# Patient Record
Sex: Female | Born: 1990
Health system: Southern US, Community
[De-identification: ages and names within clinical notes are randomized; demographics above are authoritative.]

## PROBLEM LIST (undated history)

## (undated) DIAGNOSIS — F32A Depression, unspecified: Secondary | ICD-10-CM

## (undated) DIAGNOSIS — Z87898 Personal history of other specified conditions: Secondary | ICD-10-CM

## (undated) DIAGNOSIS — F329 Major depressive disorder, single episode, unspecified: Secondary | ICD-10-CM

## (undated) DIAGNOSIS — R630 Anorexia: Secondary | ICD-10-CM

## (undated) DIAGNOSIS — Z9109 Other allergy status, other than to drugs and biological substances: Secondary | ICD-10-CM

## (undated) HISTORY — PX: NO PAST SURGERIES: SHX2092

## (undated) HISTORY — PX: EYE SURGERY: SHX253

## (undated) HISTORY — DX: Depression, unspecified: F32.A

## (undated) HISTORY — DX: Other allergy status, other than to drugs and biological substances: Z91.09

## (undated) HISTORY — DX: Major depressive disorder, single episode, unspecified: F32.9

## (undated) HISTORY — DX: Anorexia: R63.0

---

## 2015-12-23 LAB — HM PAP SMEAR: HM Pap smear: NEGATIVE

## 2016-01-25 LAB — HM PAP SMEAR: HM PAP: NORMAL

## 2017-03-02 ENCOUNTER — Encounter: Payer: Self-pay | Admitting: Physician Assistant

## 2017-03-02 ENCOUNTER — Ambulatory Visit (INDEPENDENT_AMBULATORY_CARE_PROVIDER_SITE_OTHER): Payer: 59 | Admitting: Physician Assistant

## 2017-03-02 VITALS — BP 110/70 | HR 79 | Ht 63.0 in | Wt 162.9 lb

## 2017-03-02 DIAGNOSIS — Z0000 Encounter for general adult medical examination without abnormal findings: Secondary | ICD-10-CM | POA: Diagnosis not present

## 2017-03-02 DIAGNOSIS — Z114 Encounter for screening for human immunodeficiency virus [HIV]: Secondary | ICD-10-CM

## 2017-03-02 DIAGNOSIS — N912 Amenorrhea, unspecified: Secondary | ICD-10-CM | POA: Diagnosis not present

## 2017-03-02 DIAGNOSIS — Z23 Encounter for immunization: Secondary | ICD-10-CM

## 2017-03-02 DIAGNOSIS — Z136 Encounter for screening for cardiovascular disorders: Secondary | ICD-10-CM

## 2017-03-02 LAB — POCT URINE PREGNANCY: Preg Test, Ur: NEGATIVE

## 2017-03-02 NOTE — Progress Notes (Addendum)
SCRIBE STATEMENT  Subjective:    Monica Jacobs is a 26 y.o. female and is here for a comprehensive physical exam.  HPI  Health Maintenance Due  Topic Date Due  . HIV Screening  01/31/2004    Acute Concerns: Amenorrhea -- no period since March, she states that this happens every 6 months or so, she is unconcerned, uses condoms with husband, is not actively trying to conceive, no significant weight changes or other symptoms, states that her last period was very regular, no unusual vaginal discharge or pain  Chronic Issues: None  Health Maintenance: Immunizations -- Tdap due today and agreeable Mammogram -- no early ultrasounds or early mammograms have been performed PAP -- requesting records, most recently completed last year and was normal per her report Diet -- 1-2 days of meatless meals a week, likes snacks; does not drink soda Caffeine intake -- drinks 1-2 large coffees daily, no energy drinks Sleep habits -- very regular sleeps from about 11am - 7am Exercise -- walks dog, does some at home work-outs, uses fitbit, plays kickball Monday nights Weight -- Weight: 162 lb 14.4 oz (73.9 kg)  Mood -- no anxiety issues, negative screen for depression Last period -- end of March Period characteristics -- periods are well controlled with ibuprofen and hot packs Birth control -- currently uses condoms; was on the pill stopped taking it secondary to side effects -- it helped her periods but made her hormonal  Depression screen PHQ 2/9 03/02/2017  Decreased Interest 0  Down, Depressed, Hopeless 0  PHQ - 2 Score 0   Other providers/specialists: Ob-Gyn -- [facility] Up to date on dental care Needs to visit the eye doctor   PMHx, SurgHx, SocialHx, Medications, and Allergies were reviewed in the Visit Navigator and updated as appropriate.   Past Medical History:  Diagnosis Date  . Environmental allergies     History reviewed. No pertinent surgical history.   Family  History  Problem Relation Age of Onset  . Anxiety disorder Mother   . Hypercholesterolemia Mother   . ADD / ADHD Mother   . Hyperlipidemia Father   . Cancer Father     skin  . Heart attack Maternal Grandmother   . Diabetes Maternal Grandmother   . Mental illness Sister   . Diabetes Maternal Grandfather   . Heart disease Maternal Grandfather   . Hypertension Maternal Grandfather   . Colon cancer Neg Hx   . Breast cancer Neg Hx   . Prostate cancer Neg Hx     Social History  Substance Use Topics  . Smoking status: Never Smoker  . Smokeless tobacco: Never Used  . Alcohol use Yes     Comment: Occasionally    Review of Systems:   Review of Systems  Constitutional: Negative.  Negative for chills, fever, malaise/fatigue and weight loss.  HENT: Negative.  Negative for hearing loss, sinus pain and sore throat.   Eyes: Negative.   Respiratory: Negative.  Negative for cough and hemoptysis.   Cardiovascular: Negative.  Negative for chest pain, palpitations, leg swelling and PND.  Gastrointestinal: Negative.  Negative for abdominal pain, constipation, diarrhea, heartburn, nausea and vomiting.  Genitourinary: Negative.  Negative for dysuria, frequency and urgency.  Musculoskeletal: Negative.  Negative for back pain, myalgias and neck pain.  Skin: Negative.  Negative for itching and rash.  Neurological: Negative.  Negative for dizziness, tingling, seizures and headaches.  Endo/Heme/Allergies: Negative.  Negative for polydipsia.  Psychiatric/Behavioral: Negative.  Negative for depression. The  patient is not nervous/anxious.     Objective:   BP 110/70 (BP Location: Left Arm, Patient Position: Sitting, Cuff Size: Normal)   Pulse 79   Ht 5\' 3"  (1.6 m)   Wt 162 lb 14.4 oz (73.9 kg)   LMP 01/18/2017 (Approximate) Comment: Date ended.  SpO2 99%   BMI 28.86 kg/m   General Appearance:    Alert, cooperative, no distress, appears stated age  Head:    Normocephalic, without obvious  abnormality, atraumatic  Eyes:    PERRL, conjunctiva/corneas clear, EOM's intact, fundi    benign, both eyes  Ears:    Normal TM's and external ear canals, both ears  Nose:   Nares normal, septum midline, mucosa normal, no drainage    or sinus tenderness  Throat:   Lips, mucosa, and tongue normal; teeth and gums normal  Neck:   Supple, symmetrical, trachea midline, no adenopathy;    thyroid:  no enlargement/tenderness/nodules; no carotid   bruit or JVD  Back:     Symmetric, no curvature, ROM normal, no CVA tenderness  Lungs:     Clear to auscultation bilaterally, respirations unlabored  Chest Wall:    No tenderness or deformity   Heart:    Regular rate and rhythm, S1 and S2 normal, no murmur, rub   or gallop  Breast Exam:    Deferred, sees ob-gyn  Abdomen:     Soft, non-tender, bowel sounds active all four quadrants,    no masses, no organomegaly  Genitalia:    Deferred, sees Ob-Gyn  Rectal:    Deferred, sees Ob-Gyn  Extremities:   Extremities normal, atraumatic, no cyanosis or edema  Pulses:   2+ and symmetric all extremities  Skin:   Skin color, texture, turgor normal, no rashes or lesions  Lymph nodes:   Cervical, supraclavicular, and axillary nodes normal  Neurologic:   CNII-XII intact, normal strength, sensation and reflexes    throughout    Results for orders placed or performed in visit on 03/02/17  HM PAP SMEAR  Result Value Ref Range   HM Pap smear Normal   POCT urine pregnancy  Result Value Ref Range   Preg Test, Ur Negative Negative     Assessment/Plan:   Vincent was seen today for establish care with cpe.  Diagnoses and all orders for this visit:  Routine medical exam Today patient counseled on age appropriate routine health concerns for screening and prevention, each reviewed and up to date or declined. Immunizations reviewed and up to date or declined. Labs ordered and reviewed. Risk factors for depression reviewed and negative. Hearing function and visual  acuity are intact. ADLs screened and addressed as needed. Functional ability and level of safety reviewed and appropriate. Education, counseling and referrals performed based on assessed risks today. Patient provided with a copy of personalized plan for preventive services. She is not fasting, will complete lab work within 1-2 weeks.  Encounter for HIV screening Agreeable to HIV screening  Encounter for screening for cardiovascular disease She is not fasting, will complete at later date.  Amenorrhea Negative urine pregnancy test. Will check TSH as well as routine labs. Follow-up with ob-gyn if period does not return at the end of this month.    Well Adult Exam: Labs ordered: Yes. Patient counseling was done. See below for items discussed. Discussed the patient's BMI. The BMI BMI is not in the acceptable range; BMI management plan is completed Follow up as needed for acute illness.  Patient Counseling:   [  x]    Nutrition: Stressed importance of moderation in sodium/caffeine intake, saturated fat and cholesterol, caloric balance, sufficient intake of fresh fruits, vegetables, fiber, calcium, iron, and 1 mg of folate supplement per day (for females capable of pregnancy).   [x]      Stressed the importance of regular exercise.    [x]     Substance Abuse: Discussed cessation/primary prevention of tobacco, alcohol, or other drug use; driving or other dangerous activities under the influence; availability of treatment for abuse.    [x]      Injury prevention: Discussed safety belts, safety helmets, smoke detector, smoking near bedding or upholstery.    [x]      Sexuality: Discussed sexually transmitted diseases, partner selection, use of condoms, avoidance of unintended pregnancy  and contraceptive alternatives.    [x]     Dental health: Discussed importance of regular tooth brushing, flossing, and dental visits.   [x]      Health maintenance and immunizations reviewed. Please refer to  Health maintenance section.   CMA or LPN served as scribe during this visit. History, Physical, and Plan performed by medical provider. Documentation and orders reviewed and attested to.  Inda Coke, PA-C [place]

## 2017-03-02 NOTE — Patient Instructions (Signed)
It was great meeting you today!  Please make an appointment with the lab on your way out. I would like for you to return for lab work within 1-2 weeks. After midnight on the day of the lab draw, please do not eat anything. You may have water, black coffee, unsweetened tea.    Health Maintenance, Female Adopting a healthy lifestyle and getting preventive care can go a long way to promote health and wellness. Talk with your health care provider about what schedule of regular examinations is right for you. This is a good chance for you to check in with your provider about disease prevention and staying healthy. In between checkups, there are plenty of things you can do on your own. Experts have done a lot of research about which lifestyle changes and preventive measures are most likely to keep you healthy. Ask your health care provider for more information. Weight and diet Eat a healthy diet  Be sure to include plenty of vegetables, fruits, low-fat dairy products, and lean protein.  Do not eat a lot of foods high in solid fats, added sugars, or salt.  Get regular exercise. This is one of the most important things you can do for your health.  Most adults should exercise for at least 150 minutes each week. The exercise should increase your heart rate and make you sweat (moderate-intensity exercise).  Most adults should also do strengthening exercises at least twice a week. This is in addition to the moderate-intensity exercise. Maintain a healthy weight  Body mass index (BMI) is a measurement that can be used to identify possible weight problems. It estimates body fat based on height and weight. Your health care provider can help determine your BMI and help you achieve or maintain a healthy weight.  For females 52 years of age and older:  A BMI below 18.5 is considered underweight.  A BMI of 18.5 to 24.9 is normal.  A BMI of 25 to 29.9 is considered overweight.  A BMI of 30 and above is  considered obese. Watch levels of cholesterol and blood lipids  You should start having your blood tested for lipids and cholesterol at 28 years of age, then have this test every 5 years.  You may need to have your cholesterol levels checked more often if:  Your lipid or cholesterol levels are high.  You are older than 28 years of age.  You are at high risk for heart disease. Cancer screening Lung Cancer  Lung cancer screening is recommended for adults 58-77 years old who are at high risk for lung cancer because of a history of smoking.  A yearly low-dose CT scan of the lungs is recommended for people who:  Currently smoke.  Have quit within the past 15 years.  Have at least a 30-pack-year history of smoking. A pack year is smoking an average of one pack of cigarettes a day for 1 year.  Yearly screening should continue until it has been 15 years since you quit.  Yearly screening should stop if you develop a health problem that would prevent you from having lung cancer treatment. Breast Cancer  Practice breast self-awareness. This means understanding how your breasts normally appear and feel.  It also means doing regular breast self-exams. Let your health care provider know about any changes, no matter how small.  If you are in your 20s or 30s, you should have a clinical breast exam (CBE) by a health care provider every 1-3 years as part  of a regular health exam.  If you are 3 or older, have a CBE every year. Also consider having a breast X-ray (mammogram) every year.  If you have a family history of breast cancer, talk to your health care provider about genetic screening.  If you are at high risk for breast cancer, talk to your health care provider about having an MRI and a mammogram every year.  Breast cancer gene (BRCA) assessment is recommended for women who have family members with BRCA-related cancers. BRCA-related cancers  include:  Breast.  Ovarian.  Tubal.  Peritoneal cancers.  Results of the assessment will determine the need for genetic counseling and BRCA1 and BRCA2 testing. Cervical Cancer  Your health care provider may recommend that you be screened regularly for cancer of the pelvic organs (ovaries, uterus, and vagina). This screening involves a pelvic examination, including checking for microscopic changes to the surface of your cervix (Pap test). You may be encouraged to have this screening done every 3 years, beginning at age 39.  For women ages 28-65, health care providers may recommend pelvic exams and Pap testing every 3 years, or they may recommend the Pap and pelvic exam, combined with testing for human papilloma virus (HPV), every 5 years. Some types of HPV increase your risk of cervical cancer. Testing for HPV may also be done on women of any age with unclear Pap test results.  Other health care providers may not recommend any screening for nonpregnant women who are considered low risk for pelvic cancer and who do not have symptoms. Ask your health care provider if a screening pelvic exam is right for you.  If you have had past treatment for cervical cancer or a condition that could lead to cancer, you need Pap tests and screening for cancer for at least 20 years after your treatment. If Pap tests have been discontinued, your risk factors (such as having a new sexual partner) need to be reassessed to determine if screening should resume. Some women have medical problems that increase the chance of getting cervical cancer. In these cases, your health care provider may recommend more frequent screening and Pap tests. Colorectal Cancer  This type of cancer can be detected and often prevented.  Routine colorectal cancer screening usually begins at 28 years of age and continues through 28 years of age.  Your health care provider may recommend screening at an earlier age if you have risk factors  for colon cancer.  Your health care provider may also recommend using home test kits to check for hidden blood in the stool.  A small camera at the end of a tube can be used to examine your colon directly (sigmoidoscopy or colonoscopy). This is done to check for the earliest forms of colorectal cancer.  Routine screening usually begins at age 83.  Direct examination of the colon should be repeated every 5-10 years through 28 years of age. However, you may need to be screened more often if early forms of precancerous polyps or small growths are found. Skin Cancer  Check your skin from head to toe regularly.  Tell your health care provider about any new moles or changes in moles, especially if there is a change in a mole's shape or color.  Also tell your health care provider if you have a mole that is larger than the size of a pencil eraser.  Always use sunscreen. Apply sunscreen liberally and repeatedly throughout the day.  Protect yourself by wearing long sleeves, pants,  a wide-brimmed hat, and sunglasses whenever you are outside. Heart disease, diabetes, and high blood pressure  High blood pressure causes heart disease and increases the risk of stroke. High blood pressure is more likely to develop in:  People who have blood pressure in the high end of the normal range (130-139/85-89 mm Hg).  People who are overweight or obese.  People who are African American.  If you are 66-60 years of age, have your blood pressure checked every 3-5 years. If you are 68 years of age or older, have your blood pressure checked every year. You should have your blood pressure measured twice-once when you are at a hospital or clinic, and once when you are not at a hospital or clinic. Record the average of the two measurements. To check your blood pressure when you are not at a hospital or clinic, you can use:  An automated blood pressure machine at a pharmacy.  A home blood pressure monitor.  If you  are between 28 years and 71 years old, ask your health care provider if you should take aspirin to prevent strokes.  Have regular diabetes screenings. This involves taking a blood sample to check your fasting blood sugar level.  If you are at a normal weight and have a low risk for diabetes, have this test once every three years after 28 years of age.  If you are overweight and have a high risk for diabetes, consider being tested at a younger age or more often. Preventing infection Hepatitis B  If you have a higher risk for hepatitis B, you should be screened for this virus. You are considered at high risk for hepatitis B if:  You were born in a country where hepatitis B is common. Ask your health care provider which countries are considered high risk.  Your parents were born in a high-risk country, and you have not been immunized against hepatitis B (hepatitis B vaccine).  You have HIV or AIDS.  You use needles to inject street drugs.  You live with someone who has hepatitis B.  You have had sex with someone who has hepatitis B.  You get hemodialysis treatment.  You take certain medicines for conditions, including cancer, organ transplantation, and autoimmune conditions. Hepatitis C  Blood testing is recommended for:  Everyone born from [birth year] through 1967.  Anyone with known risk factors for hepatitis C. Sexually transmitted infections (STIs)  You should be screened for sexually transmitted infections (STIs) including gonorrhea and chlamydia if:  You are sexually active and are younger than 28 years of age.  You are older than 28 years of age and your health care provider tells you that you are at risk for this type of infection.  Your sexual activity has changed since you were last screened and you are at an increased risk for chlamydia or gonorrhea. Ask your health care provider if you are at risk.  If you do not have HIV, but are at risk, it may be recommended that you  take a prescription medicine daily to prevent HIV infection. This is called pre-exposure prophylaxis (PrEP). You are considered at risk if:  You are sexually active and do not regularly use condoms or know the HIV status of your partner(s).  You take drugs by injection.  You are sexually active with a partner who has HIV. Talk with your health care provider about whether you are at high risk of being infected with HIV. If you choose to begin PrEP, you  should first be tested for HIV. You should then be tested every 3 months for as long as you are taking PrEP. Pregnancy  If you are premenopausal and you may become pregnant, ask your health care provider about preconception counseling.  If you may become pregnant, take 400 to 800 micrograms (mcg) of folic acid every day.  If you want to prevent pregnancy, talk to your health care provider about birth control (contraception). Osteoporosis and menopause  Osteoporosis is a disease in which the bones lose minerals and strength with aging. This can result in serious bone fractures. Your risk for osteoporosis can be identified using a bone density scan.  If you are 36 years of age or older, or if you are at risk for osteoporosis and fractures, ask your health care provider if you should be screened.  Ask your health care provider whether you should take a calcium or vitamin D supplement to lower your risk for osteoporosis.  Menopause may have certain physical symptoms and risks.  Hormone replacement therapy may reduce some of these symptoms and risks. Talk to your health care provider about whether hormone replacement therapy is right for you. Follow these instructions at home:  Schedule regular health, dental, and eye exams.  Stay current with your immunizations.  Do not use any tobacco products including cigarettes, chewing tobacco, or electronic cigarettes.  If you are pregnant, do not drink alcohol.  If you are breastfeeding, limit  how much and how often you drink alcohol.  Limit alcohol intake to no more than 1 drink per day for nonpregnant women. One drink equals 12 ounces of beer, 5 ounces of wine, or 1 ounces of hard liquor.  Do not use street drugs.  Do not share needles.  Ask your health care provider for help if you need support or information about quitting drugs.  Tell your health care provider if you often feel depressed.  Tell your health care provider if you have ever been abused or do not feel safe at home. This information is not intended to replace advice given to you by your health care provider. Make sure you discuss any questions you have with your health care provider. Document Released: 04/27/2011 Document Revised: 03/19/2016 Document Reviewed: 07/16/2015 Elsevier Interactive Patient Education  2017 [employer].

## 2017-03-04 ENCOUNTER — Other Ambulatory Visit (INDEPENDENT_AMBULATORY_CARE_PROVIDER_SITE_OTHER): Payer: 59

## 2017-03-04 DIAGNOSIS — Z136 Encounter for screening for cardiovascular disorders: Secondary | ICD-10-CM

## 2017-03-04 DIAGNOSIS — Z0000 Encounter for general adult medical examination without abnormal findings: Secondary | ICD-10-CM | POA: Diagnosis not present

## 2017-03-04 DIAGNOSIS — N912 Amenorrhea, unspecified: Secondary | ICD-10-CM

## 2017-03-04 LAB — CBC WITH DIFFERENTIAL/PLATELET
Basophils Absolute: 0 10*3/uL (ref 0.0–0.1)
Basophils Relative: 0.6 % (ref 0.0–3.0)
EOS ABS: 0.1 10*3/uL (ref 0.0–0.7)
Eosinophils Relative: 1.3 % (ref 0.0–5.0)
HEMATOCRIT: 40.2 % (ref 36.0–46.0)
HEMOGLOBIN: 13.5 g/dL (ref 12.0–15.0)
LYMPHS PCT: 28 % (ref 12.0–46.0)
Lymphs Abs: 1.8 10*3/uL (ref 0.7–4.0)
MCHC: 33.6 g/dL (ref 30.0–36.0)
MCV: 89.5 fl (ref 78.0–100.0)
MONO ABS: 0.5 10*3/uL (ref 0.1–1.0)
Monocytes Relative: 7.2 % (ref 3.0–12.0)
NEUTROS ABS: 4 10*3/uL (ref 1.4–7.7)
Neutrophils Relative %: 62.9 % (ref 43.0–77.0)
Platelets: 165 10*3/uL (ref 150.0–400.0)
RBC: 4.49 Mil/uL (ref 3.87–5.11)
RDW: 13.6 % (ref 11.5–15.5)
WBC: 6.4 10*3/uL (ref 4.0–10.5)

## 2017-03-04 LAB — LIPID PANEL
Cholesterol: 158 mg/dL (ref 0–200)
HDL: 52.8 mg/dL (ref >39.00)
LDL Cholesterol: 94 mg/dL (ref 0–99)
NONHDL: 105.01
Total CHOL/HDL Ratio: 3
Triglycerides: 57 mg/dL (ref 0.0–149.0)
VLDL: 11.4 mg/dL (ref 0.0–40.0)

## 2017-03-04 LAB — COMPREHENSIVE METABOLIC PANEL
ALBUMIN: 4.3 g/dL (ref 3.5–5.2)
ALT: 13 U/L (ref 0–35)
AST: 20 U/L (ref 0–37)
Alkaline Phosphatase: 46 U/L (ref 39–117)
BILIRUBIN TOTAL: 0.4 mg/dL (ref 0.2–1.2)
BUN: 10 mg/dL (ref 6–23)
CALCIUM: 9.3 mg/dL (ref 8.4–10.5)
CHLORIDE: 107 meq/L (ref 96–112)
CO2: 25 mEq/L (ref 19–32)
CREATININE: 0.88 mg/dL (ref 0.40–1.20)
GFR: 81.27 mL/min (ref >60.00)
Glucose, Bld: 93 mg/dL (ref 70–99)
Potassium: 4 mEq/L (ref 3.5–5.1)
SODIUM: 140 meq/L (ref 135–145)
TOTAL PROTEIN: 7 g/dL (ref 6.0–8.3)

## 2017-03-04 LAB — T4, FREE: Free T4: 0.57 ng/dL — ABNORMAL LOW (ref 0.60–1.60)

## 2017-03-04 LAB — TSH: TSH: 2.11 u[IU]/mL (ref 0.35–4.50)

## 2017-03-10 ENCOUNTER — Telehealth: Payer: Self-pay | Admitting: Physician Assistant

## 2017-03-10 NOTE — Telephone Encounter (Signed)
ROI fax to [facility] office

## 2017-03-16 ENCOUNTER — Encounter: Payer: Self-pay | Admitting: Physician Assistant

## 2017-03-16 LAB — NEISSERIA GONORRHOEAE, PROBE AMP: Neisseria gonorrhoeae, NAA: NEGATIVE

## 2017-03-16 LAB — CHLAMYDIA TRACHOMATIS, PROBE AMP: Chlamydia Trachomatis DNA, SDA: NEGATIVE

## 2017-03-29 ENCOUNTER — Encounter: Payer: Self-pay | Admitting: Physician Assistant

## 2018-12-28 LAB — OB RESULTS CONSOLE HEPATITIS B SURFACE ANTIGEN: Hepatitis B Surface Ag: NEGATIVE

## 2018-12-28 LAB — OB RESULTS CONSOLE ABO/RH: RH Type: POSITIVE

## 2018-12-28 LAB — OB RESULTS CONSOLE GC/CHLAMYDIA
Chlamydia: NEGATIVE
Gonorrhea: NEGATIVE

## 2018-12-28 LAB — OB RESULTS CONSOLE HIV ANTIBODY (ROUTINE TESTING): HIV: NONREACTIVE

## 2018-12-28 LAB — OB RESULTS CONSOLE ANTIBODY SCREEN: Antibody Screen: NEGATIVE

## 2018-12-28 LAB — OB RESULTS CONSOLE RPR: RPR: NONREACTIVE

## 2018-12-28 LAB — OB RESULTS CONSOLE RUBELLA ANTIBODY, IGM: Rubella: IMMUNE

## 2019-07-10 LAB — OB RESULTS CONSOLE GBS: GBS: NEGATIVE

## 2019-07-20 ENCOUNTER — Encounter (HOSPITAL_COMMUNITY): Payer: Self-pay | Admitting: *Deleted

## 2019-07-20 NOTE — Patient Instructions (Signed)
Monica Jacobs  07/20/2019   Your procedure is scheduled on:  07/27/2019  Arrive at 1:00pm at Entrance C on [address] at [facility]  and [address]. You are invited to use the FREE valet parking or use the Visitor's parking deck.  Pick up the phone at the desk and dial [phone].  Call this number if you have problems the morning of surgery: [phone]  Remember:   Do not eat food:(After Midnight) Desps de medianoche.  Do not drink clear liquids: (6 Hours before arrival) 6 horas ante llegada.  Take these medicines the morning of surgery with A SIP OF WATER:  none   Do not wear jewelry, make-up or nail polish.  Do not wear lotions, powders, or perfumes. Do not wear deodorant.  Do not shave 48 hours prior to surgery.  Do not bring valuables to the hospital.  [facility] is not   responsible for any belongings or valuables brought to the hospital.  Contacts, dentures or bridgework may not be worn into surgery.  Leave suitcase in the car. After surgery it may be brought to your room.  For patients admitted to the hospital, checkout time is 11:00 AM the day of              discharge.      Please read over the following fact sheets that you were given:     Preparing for Surgery

## 2019-07-21 NOTE — H&P (Signed)
Monica Jacobs is a 28 y.o. female G1 at 37 weeks presenting for primary C/S for breech presentation.  Patient was offered ECV and declined.  Antepartum course uncomplicated.  GBS negative.  OB History    Gravida  1   Para      Term      Preterm      AB      Living        SAB      TAB      Ectopic      Multiple      Live Births             Past Medical History:  Diagnosis Date  . Anorexia   . Depression   . Environmental allergies    Past Surgical History:  Procedure Laterality Date  . NO PAST SURGERIES     Family History: family history includes ADD / ADHD in her mother; Anxiety disorder in her mother; Cancer in her father; Diabetes in her maternal grandfather; Heart attack in her maternal grandmother; Heart disease in her maternal grandfather; Hypercholesterolemia in her mother; Mental illness in her sister. Social History:  reports that she has never smoked. She has never used smokeless tobacco. She reports current alcohol use. She reports that she does not use drugs.     Maternal Diabetes: No Genetic Screening: Normal Maternal Ultrasounds/Referrals: Normal Fetal Ultrasounds or other Referrals:  None Maternal Substance Abuse:  No Significant Maternal Medications:  None Significant Maternal Lab Results:  Group B Strep negative Other Comments:  None  ROS Maternal Medical History:  Prenatal complications: no prenatal complications Prenatal Complications - Diabetes: none.      Last menstrual period 10/16/2018. Maternal Exam:  Abdomen: Patient reports no abdominal tenderness. Fundal height is c/w dates.   Estimated fetal weight is 7#8.   Fetal presentation: breech     Physical Exam  Constitutional: She is oriented to person, place, and time. She appears well-developed and well-nourished.  Respiratory: Effort normal.  GI: Soft. There is no rebound and no guarding.  Neurological: She is alert and oriented to person, place, and time.  Skin: Skin  is warm and dry.  Psychiatric: She has a normal mood and affect. Her behavior is normal.    Prenatal labs: ABO, Rh: A/Positive/-- (03/04 0000) Antibody: Negative (03/04 0000) Rubella: Immune (03/04 0000) RPR: Nonreactive (03/04 0000)  HBsAg: Negative (03/04 0000)  HIV: Non-reactive (03/04 0000)  GBS: Negative/-- (09/14 0000)   Assessment/Plan: 28yo G1 at 39 weeks with breech presentation -Primary C/S -Patient has been counseled re: risk of bleeding, infection, scarring and damage to surrounding structures.  She understands future risk of abnormal placentation and uterine rupture.  All questions were answered and the patient wishes to proceed.   Linda Hedges 07/21/2019, 12:01 PM

## 2019-07-25 ENCOUNTER — Other Ambulatory Visit (HOSPITAL_COMMUNITY)
Admission: RE | Admit: 2019-07-25 | Discharge: 2019-07-25 | Disposition: A | Discharge status: Home / Self Care | Payer: No Typology Code available for payment source | Source: Ambulatory Visit | Attending: Obstetrics and Gynecology | Admitting: Obstetrics and Gynecology

## 2019-07-25 ENCOUNTER — Other Ambulatory Visit: Payer: Self-pay

## 2019-07-25 LAB — CBC
HCT: 36.9 % (ref 36.0–46.0)
Hemoglobin: 12.7 g/dL (ref 12.0–15.0)
MCH: 31.5 pg (ref 26.0–34.0)
MCHC: 34.4 g/dL (ref 30.0–36.0)
MCV: 91.6 fL (ref 80.0–100.0)
Platelets: 140 10*3/uL — ABNORMAL LOW (ref 150–400)
RBC: 4.03 MIL/uL (ref 3.87–5.11)
RDW: 13.6 % (ref 11.5–15.5)
WBC: 7.8 10*3/uL (ref 4.0–10.5)
nRBC: 0 % (ref 0.0–0.2)

## 2019-07-25 LAB — SARS CORONAVIRUS 2 (TAT 6-24 HRS): SARS Coronavirus 2: NEGATIVE

## 2019-07-25 NOTE — MAU Note (Signed)
Asymptomatic, swab collected. 

## 2019-07-26 LAB — RPR: RPR Ser Ql: NONREACTIVE — AB

## 2019-07-27 ENCOUNTER — Inpatient Hospital Stay (HOSPITAL_COMMUNITY): Payer: No Typology Code available for payment source | Admitting: Anesthesiology

## 2019-07-27 ENCOUNTER — Encounter (HOSPITAL_COMMUNITY): Payer: Self-pay | Admitting: *Deleted

## 2019-07-27 ENCOUNTER — Inpatient Hospital Stay (HOSPITAL_COMMUNITY)
Admission: RE | Admit: 2019-07-27 | Discharge: 2019-07-29 | DRG: 788 | Disposition: A | Discharge status: Home / Self Care | Payer: No Typology Code available for payment source | Attending: Obstetrics & Gynecology | Admitting: Obstetrics & Gynecology

## 2019-07-27 ENCOUNTER — Encounter (HOSPITAL_COMMUNITY)
Admission: RE | Disposition: A | Discharge status: Home / Self Care | Payer: Self-pay | Source: Home / Self Care | Attending: Obstetrics & Gynecology

## 2019-07-27 DIAGNOSIS — O321XX0 Maternal care for breech presentation, not applicable or unspecified: Secondary | ICD-10-CM | POA: Diagnosis present

## 2019-07-27 DIAGNOSIS — Z98891 History of uterine scar from previous surgery: Secondary | ICD-10-CM

## 2019-07-27 DIAGNOSIS — Z20828 Contact with and (suspected) exposure to other viral communicable diseases: Secondary | ICD-10-CM | POA: Diagnosis present

## 2019-07-27 DIAGNOSIS — Z3A39 39 weeks gestation of pregnancy: Secondary | ICD-10-CM | POA: Diagnosis not present

## 2019-07-27 DIAGNOSIS — Z23 Encounter for immunization: Secondary | ICD-10-CM

## 2019-07-27 LAB — BPAM RBC
Blood Product Expiration Date: 202010212359
Blood Product Expiration Date: 202010212359
Unit Type and Rh: 600
Unit Type and Rh: 600

## 2019-07-27 LAB — TYPE AND SCREEN
ABO/RH(D): A NEG
Antibody Screen: POSITIVE
Unit division: 0
Unit division: 0

## 2019-07-27 SURGERY — Surgical Case
Anesthesia: Spinal | Site: Abdomen | Wound class: Clean Contaminated

## 2019-07-27 MED ORDER — ACETAMINOPHEN 500 MG PO TABS
1000.0000 mg | ORAL_TABLET | Freq: Four times a day (QID) | ORAL | Status: AC
  Administered 2019-07-27 – 2019-07-28 (×4): 1000 mg via ORAL
  Filled 2019-07-27 (×4): qty 2

## 2019-07-27 MED ORDER — FENTANYL CITRATE (PF) 100 MCG/2ML IJ SOLN
INTRAMUSCULAR | Status: DC | PRN
  Administered 2019-07-27: 15 ug via INTRAVENOUS

## 2019-07-27 MED ORDER — MEPERIDINE HCL 25 MG/ML IJ SOLN
INTRAMUSCULAR | Status: AC
  Filled 2019-07-27: qty 1

## 2019-07-27 MED ORDER — SIMETHICONE 80 MG PO CHEW
80.0000 mg | CHEWABLE_TABLET | ORAL | Status: DC
  Administered 2019-07-27 – 2019-07-28 (×2): 80 mg via ORAL
  Filled 2019-07-27 (×2): qty 1

## 2019-07-27 MED ORDER — SODIUM CHLORIDE 0.9 % IV SOLN
INTRAVENOUS | Status: DC | PRN
  Administered 2019-07-27: 40 [IU] via INTRAVENOUS

## 2019-07-27 MED ORDER — ONDANSETRON HCL 4 MG/2ML IJ SOLN: 4.0000 mg | Freq: Three times a day (TID) | INTRAMUSCULAR | Status: DC | PRN

## 2019-07-27 MED ORDER — OXYCODONE-ACETAMINOPHEN 5-325 MG PO TABS: 1.0000 | ORAL_TABLET | ORAL | Status: DC | PRN

## 2019-07-27 MED ORDER — SODIUM CHLORIDE 0.9 % IV SOLN
INTRAVENOUS | Status: DC | PRN
  Administered 2019-07-27: 16:00:00 via INTRAVENOUS

## 2019-07-27 MED ORDER — ACETAMINOPHEN 325 MG PO TABS
650.0000 mg | ORAL_TABLET | ORAL | Status: DC | PRN
  Administered 2019-07-29 (×2): 650 mg via ORAL
  Filled 2019-07-27 (×2): qty 2

## 2019-07-27 MED ORDER — BUPIVACAINE IN DEXTROSE 0.75-8.25 % IT SOLN
INTRATHECAL | Status: DC | PRN
  Administered 2019-07-27: 1.5 mL via INTRATHECAL

## 2019-07-27 MED ORDER — KETOROLAC TROMETHAMINE 30 MG/ML IJ SOLN: 30.0000 mg | Freq: Four times a day (QID) | INTRAMUSCULAR | Status: AC | PRN

## 2019-07-27 MED ORDER — COCONUT OIL OIL: 1.0000 "application " | TOPICAL_OIL | Status: DC | PRN

## 2019-07-27 MED ORDER — MORPHINE SULFATE (PF) 0.5 MG/ML IJ SOLN
INTRAMUSCULAR | Status: AC
  Filled 2019-07-27: qty 10

## 2019-07-27 MED ORDER — WITCH HAZEL-GLYCERIN EX PADS: 1.0000 "application " | MEDICATED_PAD | CUTANEOUS | Status: DC | PRN

## 2019-07-27 MED ORDER — CEFAZOLIN SODIUM-DEXTROSE 2-4 GM/100ML-% IV SOLN
2.0000 g | INTRAVENOUS | Status: AC
  Administered 2019-07-27: 16:00:00 2 g via INTRAVENOUS

## 2019-07-27 MED ORDER — OXYTOCIN 40 UNITS IN NORMAL SALINE INFUSION - SIMPLE MED: 2.5000 [IU]/h | INTRAVENOUS | Status: AC

## 2019-07-27 MED ORDER — DIPHENHYDRAMINE HCL 25 MG PO CAPS: 25.0000 mg | ORAL_CAPSULE | Freq: Four times a day (QID) | ORAL | Status: DC | PRN

## 2019-07-27 MED ORDER — NALOXONE HCL 4 MG/10ML IJ SOLN
1.0000 ug/kg/h | INTRAVENOUS | Status: DC | PRN
  Filled 2019-07-27: qty 5

## 2019-07-27 MED ORDER — SCOPOLAMINE 1 MG/3DAYS TD PT72: 1.0000 | MEDICATED_PATCH | Freq: Once | TRANSDERMAL | Status: DC

## 2019-07-27 MED ORDER — SODIUM CHLORIDE 0.9% FLUSH: 3.0000 mL | INTRAVENOUS | Status: DC | PRN

## 2019-07-27 MED ORDER — LACTATED RINGERS IV SOLN: INTRAVENOUS | Status: DC

## 2019-07-27 MED ORDER — NALBUPHINE SYRINGE 5 MG/0.5 ML
5.0000 mg | INJECTION | INTRAMUSCULAR | Status: DC | PRN
  Filled 2019-07-27: qty 0.5

## 2019-07-27 MED ORDER — ONDANSETRON HCL 4 MG/2ML IJ SOLN
INTRAMUSCULAR | Status: DC | PRN
  Administered 2019-07-27: 4 mg via INTRAVENOUS

## 2019-07-27 MED ORDER — INFLUENZA VAC SPLIT QUAD 0.5 ML IM SUSY
0.5000 mL | PREFILLED_SYRINGE | INTRAMUSCULAR | Status: AC
  Administered 2019-07-29: 10:00:00 0.5 mL via INTRAMUSCULAR
  Filled 2019-07-27: qty 0.5

## 2019-07-27 MED ORDER — METOCLOPRAMIDE HCL 5 MG/ML IJ SOLN: 10.0000 mg | Freq: Once | INTRAMUSCULAR | Status: DC | PRN

## 2019-07-27 MED ORDER — NALOXONE HCL 0.4 MG/ML IJ SOLN: 0.4000 mg | INTRAMUSCULAR | Status: DC | PRN

## 2019-07-27 MED ORDER — ZOLPIDEM TARTRATE 5 MG PO TABS: 5.0000 mg | ORAL_TABLET | Freq: Every evening | ORAL | Status: DC | PRN

## 2019-07-27 MED ORDER — DIPHENHYDRAMINE HCL 50 MG/ML IJ SOLN
12.5000 mg | INTRAMUSCULAR | Status: DC | PRN
  Administered 2019-07-27: 12.5 mg via INTRAVENOUS
  Filled 2019-07-27: qty 1

## 2019-07-27 MED ORDER — FENTANYL CITRATE (PF) 100 MCG/2ML IJ SOLN: 25.0000 ug | INTRAMUSCULAR | Status: DC | PRN

## 2019-07-27 MED ORDER — LACTATED RINGERS IV SOLN
INTRAVENOUS | Status: DC
  Administered 2019-07-28: via INTRAVENOUS

## 2019-07-27 MED ORDER — SCOPOLAMINE 1 MG/3DAYS TD PT72
MEDICATED_PATCH | TRANSDERMAL | Status: DC | PRN
  Administered 2019-07-27: 1 via TRANSDERMAL

## 2019-07-27 MED ORDER — PHENYLEPHRINE HCL-NACL 20-0.9 MG/250ML-% IV SOLN
INTRAVENOUS | Status: AC
  Filled 2019-07-27: qty 250

## 2019-07-27 MED ORDER — NALBUPHINE SYRINGE 5 MG/0.5 ML
5.0000 mg | INJECTION | Freq: Once | INTRAMUSCULAR | Status: DC | PRN
  Filled 2019-07-27: qty 0.5

## 2019-07-27 MED ORDER — LACTATED RINGERS IV SOLN
INTRAVENOUS | Status: DC
  Administered 2019-07-27 (×2): via INTRAVENOUS

## 2019-07-27 MED ORDER — CEFAZOLIN SODIUM-DEXTROSE 2-4 GM/100ML-% IV SOLN
INTRAVENOUS | Status: AC
  Filled 2019-07-27: qty 100

## 2019-07-27 MED ORDER — TETANUS-DIPHTH-ACELL PERTUSSIS 5-2.5-18.5 LF-MCG/0.5 IM SUSP: 0.5000 mL | Freq: Once | INTRAMUSCULAR | Status: DC

## 2019-07-27 MED ORDER — MENTHOL 3 MG MT LOZG: 1.0000 | LOZENGE | OROMUCOSAL | Status: DC | PRN

## 2019-07-27 MED ORDER — DIPHENHYDRAMINE HCL 25 MG PO CAPS: 25.0000 mg | ORAL_CAPSULE | ORAL | Status: DC | PRN

## 2019-07-27 MED ORDER — DIBUCAINE (PERIANAL) 1 % EX OINT: 1.0000 "application " | TOPICAL_OINTMENT | CUTANEOUS | Status: DC | PRN

## 2019-07-27 MED ORDER — IBUPROFEN 800 MG PO TABS
800.0000 mg | ORAL_TABLET | Freq: Four times a day (QID) | ORAL | Status: DC
  Administered 2019-07-28 – 2019-07-29 (×5): 800 mg via ORAL
  Filled 2019-07-27 (×5): qty 1

## 2019-07-27 MED ORDER — MEPERIDINE HCL 25 MG/ML IJ SOLN
INTRAMUSCULAR | Status: DC | PRN
  Administered 2019-07-27 (×2): 12.5 mg via INTRAVENOUS

## 2019-07-27 MED ORDER — SENNOSIDES-DOCUSATE SODIUM 8.6-50 MG PO TABS
2.0000 | ORAL_TABLET | ORAL | Status: DC
  Administered 2019-07-27 – 2019-07-28 (×2): 2 via ORAL
  Filled 2019-07-27 (×2): qty 2

## 2019-07-27 MED ORDER — SCOPOLAMINE 1 MG/3DAYS TD PT72
MEDICATED_PATCH | TRANSDERMAL | Status: AC
  Filled 2019-07-27: qty 1

## 2019-07-27 MED ORDER — PHENYLEPHRINE 40 MCG/ML (10ML) SYRINGE FOR IV PUSH (FOR BLOOD PRESSURE SUPPORT)
PREFILLED_SYRINGE | INTRAVENOUS | Status: AC
  Filled 2019-07-27: qty 10

## 2019-07-27 MED ORDER — METOCLOPRAMIDE HCL 5 MG/ML IJ SOLN
INTRAMUSCULAR | Status: DC | PRN
  Administered 2019-07-27: 10 mg via INTRAVENOUS

## 2019-07-27 MED ORDER — MORPHINE SULFATE (PF) 0.5 MG/ML IJ SOLN
INTRAMUSCULAR | Status: DC | PRN
  Administered 2019-07-27: .15 mg via EPIDURAL

## 2019-07-27 MED ORDER — SIMETHICONE 80 MG PO CHEW
80.0000 mg | CHEWABLE_TABLET | Freq: Three times a day (TID) | ORAL | Status: DC
  Administered 2019-07-28 – 2019-07-29 (×3): 80 mg via ORAL
  Filled 2019-07-27 (×3): qty 1

## 2019-07-27 MED ORDER — METOCLOPRAMIDE HCL 5 MG/ML IJ SOLN
INTRAMUSCULAR | Status: AC
  Filled 2019-07-27: qty 2

## 2019-07-27 MED ORDER — SIMETHICONE 80 MG PO CHEW: 80.0000 mg | CHEWABLE_TABLET | ORAL | Status: DC | PRN

## 2019-07-27 MED ORDER — OXYTOCIN 40 UNITS IN NORMAL SALINE INFUSION - SIMPLE MED
INTRAVENOUS | Status: AC
  Filled 2019-07-27: qty 1000

## 2019-07-27 MED ORDER — DEXAMETHASONE SODIUM PHOSPHATE 4 MG/ML IJ SOLN
INTRAMUSCULAR | Status: DC | PRN
  Administered 2019-07-27: 4 mg via INTRAVENOUS

## 2019-07-27 MED ORDER — ONDANSETRON HCL 4 MG/2ML IJ SOLN
INTRAMUSCULAR | Status: AC
  Filled 2019-07-27: qty 2

## 2019-07-27 MED ORDER — DEXAMETHASONE SODIUM PHOSPHATE 4 MG/ML IJ SOLN
INTRAMUSCULAR | Status: AC
  Filled 2019-07-27: qty 5

## 2019-07-27 MED ORDER — PHENYLEPHRINE HCL-NACL 20-0.9 MG/250ML-% IV SOLN
INTRAVENOUS | Status: DC | PRN
  Administered 2019-07-27: 60 ug/min via INTRAVENOUS

## 2019-07-27 MED ORDER — FENTANYL CITRATE (PF) 100 MCG/2ML IJ SOLN
INTRAMUSCULAR | Status: AC
  Filled 2019-07-27: qty 2

## 2019-07-27 MED ORDER — MEPERIDINE HCL 25 MG/ML IJ SOLN: 6.2500 mg | INTRAMUSCULAR | Status: DC | PRN

## 2019-07-27 MED ORDER — PRENATAL MULTIVITAMIN CH
1.0000 | ORAL_TABLET | Freq: Every day | ORAL | Status: DC
  Administered 2019-07-28 – 2019-07-29 (×2): 1 via ORAL
  Filled 2019-07-27 (×2): qty 1

## 2019-07-27 MED ORDER — KETOROLAC TROMETHAMINE 30 MG/ML IJ SOLN
30.0000 mg | Freq: Four times a day (QID) | INTRAMUSCULAR | Status: AC | PRN
  Administered 2019-07-27: 30 mg via INTRAVENOUS
  Filled 2019-07-27: qty 1

## 2019-07-27 SURGICAL SUPPLY — 33 items
BENZOIN TINCTURE PRP APPL 2/3 (GAUZE/BANDAGES/DRESSINGS) ×3 IMPLANT
CHLORAPREP W/TINT 26ML (MISCELLANEOUS) ×3 IMPLANT
CLAMP CORD UMBIL (MISCELLANEOUS) IMPLANT
CLOSURE WOUND 1/2 X4 (GAUZE/BANDAGES/DRESSINGS) ×2
CLOTH BEACON ORANGE TIMEOUT ST (SAFETY) ×3 IMPLANT
DERMABOND ADVANCED (GAUZE/BANDAGES/DRESSINGS)
DERMABOND ADVANCED .7 DNX12 (GAUZE/BANDAGES/DRESSINGS) IMPLANT
DRSG OPSITE POSTOP 4X10 (GAUZE/BANDAGES/DRESSINGS) ×3 IMPLANT
ELECT REM PT RETURN 9FT ADLT (ELECTROSURGICAL) ×3
ELECTRODE REM PT RTRN 9FT ADLT (ELECTROSURGICAL) ×1 IMPLANT
EXTRACTOR VACUUM KIWI (MISCELLANEOUS) IMPLANT
GLOVE BIO SURGEON STRL SZ 6 (GLOVE) ×3 IMPLANT
GLOVE BIOGEL PI IND STRL 6 (GLOVE) ×2 IMPLANT
GLOVE BIOGEL PI IND STRL 7.0 (GLOVE) ×1 IMPLANT
GLOVE BIOGEL PI INDICATOR 6 (GLOVE) ×4
GLOVE BIOGEL PI INDICATOR 7.0 (GLOVE) ×2
GOWN STRL REUS W/TWL LRG LVL3 (GOWN DISPOSABLE) ×6 IMPLANT
KIT ABG SYR 3ML LUER SLIP (SYRINGE) ×3 IMPLANT
NEEDLE HYPO 25X5/8 SAFETYGLIDE (NEEDLE) ×3 IMPLANT
NS IRRIG 1000ML POUR BTL (IV SOLUTION) ×3 IMPLANT
PACK C SECTION WH (CUSTOM PROCEDURE TRAY) ×3 IMPLANT
PAD OB MATERNITY 4.3X12.25 (PERSONAL CARE ITEMS) ×3 IMPLANT
PENCIL SMOKE EVAC W/HOLSTER (ELECTROSURGICAL) ×3 IMPLANT
STRIP CLOSURE SKIN 1/2X4 (GAUZE/BANDAGES/DRESSINGS) ×4 IMPLANT
SUT CHROMIC 0 CTX 36 (SUTURE) ×9 IMPLANT
SUT MON AB 2-0 CT1 27 (SUTURE) ×3 IMPLANT
SUT PDS AB 0 CT1 27 (SUTURE) IMPLANT
SUT PLAIN 0 NONE (SUTURE) IMPLANT
SUT VIC AB 0 CT1 36 (SUTURE) IMPLANT
SUT VIC AB 4-0 KS 27 (SUTURE) ×3 IMPLANT
TOWEL OR 17X24 6PK STRL BLUE (TOWEL DISPOSABLE) ×3 IMPLANT
TRAY FOLEY W/BAG SLVR 14FR LF (SET/KITS/TRAYS/PACK) IMPLANT
WATER STERILE IRR 1000ML POUR (IV SOLUTION) ×3 IMPLANT

## 2019-07-27 NOTE — Anesthesia Procedure Notes (Deleted)
Spinal

## 2019-07-27 NOTE — Lactation Note (Signed)
This note was copied from a baby's chart. Lactation Consultation Note  Patient Name: Monica Jacobs [id number] Date: 07/27/2019 Reason for consult: Initial assessment;Mother's request;Primapara;1st time breastfeeding;Difficult latch;Term  1026 - 1050 - I conducted an initial consult with Monica Jacobs. Her son, Monica Jacobs, is now 7 hours old. He has not latched since delivery. Monica Jacobs was holding him skin to skin upon entry.  I first showed Monica Jacobs how to hand express. Her breasts are WNL, and her nipples are short shafted. I noted a small glisten of colostrum.  Monica Jacobs was able to repeat back the hand expression.  I attempted to wake Monica Jacobs. I changed a meconium diaper and placed him in football hold on her right breast. He licked a little and nuzzled but did not show strong interest in latching. He would not latch on my gloved finger.  Because Monica Jacobs is short shafted, I reccommended that she pre-pump with her manual pump and wear her breast shells when possible. I also fitted her with a size 20 nipple shield and spoke to her RN about having it as a back up tonight if needed.   Plan: Breast feed on demand 8-12 times a day. Wake baby up if sleepy. Pre-pump prior to latching baby. Utilize nipple shield if baby is having difficulty achieving or maintaining latch tonight.   Maternal Data Formula Feeding for Exclusion: No Has patient been taught Hand Expression?: Yes Does the patient have breastfeeding experience prior to this delivery?: No  Feeding Feeding Type: Breast Fed  LATCH Score Latch: Too sleepy or reluctant, no latch achieved, no sucking elicited.  Audible Swallowing: None  Type of Nipple: Everted at rest and after stimulation  Comfort (Breast/Nipple): Soft / non-tender  Hold (Positioning): Assistance needed to correctly position infant at breast and maintain latch.  LATCH Score: 5  Interventions Interventions: Breast feeding basics reviewed;Assisted with  latch;Skin to skin;Breast massage;Hand express;Pre-pump if needed;Adjust position;Support pillows;Shells;Hand pump  Lactation Tools Discussed/Used Pump Review: Setup, frequency, and cleaning Initiated by:: hl Date initiated:: 07/27/19   Consult Status Consult Status: Follow-up Date: 07/28/19 Follow-up type: In-patient    Lenore Manner 07/27/2019, 11:05 PM

## 2019-07-27 NOTE — Anesthesia Preprocedure Evaluation (Signed)
Anesthesia Evaluation  Patient identified by MRN, date of birth, ID band Patient awake    Reviewed: Allergy & Precautions, NPO status , Patient's Chart, lab work & pertinent test results  Airway Mallampati: II  TM Distance: >3 FB Neck ROM: Full    Dental no notable dental hx.    Pulmonary neg pulmonary ROS,    Pulmonary exam normal breath sounds clear to auscultation       Cardiovascular negative cardio ROS Normal cardiovascular exam Rhythm:Regular Rate:Normal     Neuro/Psych negative neurological ROS  negative psych ROS   GI/Hepatic negative GI ROS, Neg liver ROS,   Endo/Other  negative endocrine ROS  Renal/GU negative Renal ROS  negative genitourinary   Musculoskeletal negative musculoskeletal ROS (+)   Abdominal   Peds negative pediatric ROS (+)  Hematology negative hematology ROS (+)   Anesthesia Other Findings   Reproductive/Obstetrics (+) Pregnancy                             Anesthesia Physical Anesthesia Plan  ASA: II  Anesthesia Plan: Spinal   Post-op Pain Management:    Induction:   PONV Risk Score and Plan: 2 and Ondansetron, Treatment may vary due to age or medical condition and Scopolamine patch - Pre-op  Airway Management Planned: Natural Airway  Additional Equipment:   Intra-op Plan:   Post-operative Plan:   Informed Consent: I have reviewed the patients History and Physical, chart, labs and discussed the procedure including the risks, benefits and alternatives for the proposed anesthesia with the patient or authorized representative who has indicated his/her understanding and acceptance.     Dental advisory given  Plan Discussed with:   Anesthesia Plan Comments:         Anesthesia Quick Evaluation

## 2019-07-27 NOTE — Progress Notes (Signed)
No change to H&P.  Patient declines U/S for presentation stating regardless, she wants primary C/S.  Linda Hedges, DO

## 2019-07-27 NOTE — Op Note (Signed)
Monica Jacobs PROCEDURE DATE: 07/27/2019  PREOPERATIVE DIAGNOSIS: Intrauterine pregnancy at  [redacted]w[redacted]d weeks gestation, breech presentation  POSTOPERATIVE DIAGNOSIS: The same  PROCEDURE: Primary Low Transverse Cesarean Section  SURGEON:  Dr. Linda Hedges  INDICATIONS: Monica Jacobs is a 28 y.o. G1P0 at [redacted]w[redacted]d scheduled for cesarean section secondary to breech presentation.  The risks of cesarean section discussed with the patient included but were not limited to: bleeding which may require transfusion or reoperation; infection which may require antibiotics; injury to bowel, bladder, ureters or other surrounding organs; injury to the fetus; need for additional procedures including hysterectomy in the event of a life-threatening hemorrhage; placental abnormalities wth subsequent pregnancies, incisional problems, thromboembolic phenomenon and other postoperative/anesthesia complications. The patient concurred with the proposed plan, giving informed written consent for the procedure.    FINDINGS:  Viable female infant in breech presentation, APGARs 9,9: weight pending.  Clear amniotic fluid.  Intact placenta, three vessel cord.  Grossly normal uterus, ovaries and fallopian tubes. .   ANESTHESIA: Spinal ESTIMATED BLOOD LOSS: 600 ml SPECIMENS: Placenta sent to L&D COMPLICATIONS: None immediate  PROCEDURE IN DETAIL:  The patient received intravenous antibiotics and had sequential compression devices applied to her lower extremities while in the preoperative area.  She was then taken to the operating room where spinal anesthesia was administered and was found to be adequate. She was then placed in a dorsal supine position with a leftward tilt, and prepped and draped in a sterile manner.  A foley catheter was placed into her bladder and attached to constant gravity.  After an adequate timeout was performed, a Pfannenstiel skin incision was made with scalpel and carried through to the underlying layer of  fascia. The fascia was incised in the midline and this incision was extended bilaterally using the Mayo scissors. Kocher clamps were applied to the superior aspect of the fascial incision and the underlying rectus muscles were dissected off bluntly. A similar process was carried out on the inferior aspect of the facial incision. The rectus muscles were separated in the midline bluntly and the peritoneum was entered bluntly. Bladder flap was created sharply and developed bluntly.  Bladder blade was placed.  A transverse hysterotomy was made with a scalpel and extended bilaterally bluntly. The bladder blade was then removed. The infant was successfully delivered using typical breech maneuvers, and cord was clamped and cut and infant was handed over to awaiting neonatology team. Uterine massage was then administered and the placenta delivered intact with three-vessel cord. The uterus was cleared of clot and debris.  The hysterotomy was closed with 0 chromic.  A second imbricating suture of 0-chromic was used to reinforce the incision and aid in hemostasis.  The peritoneum and rectus muscles were noted to be hemostatic and were reapproximated using 2-0 monocryl in a running fashion.  The fascia was closed with 0-Vicryl in a running fashion with good restoration of anatomy.  The subcutaneus tissue was copiously irrigated.  The skin was closed with 4-0 vicryl in a subcuticular fashion.  Pt tolerated the procedure will.  All counts were correct x2.  Pt went to the recovery room in stable condition.

## 2019-07-27 NOTE — Transfer of Care (Signed)
Immediate Anesthesia Transfer of Care Note  Patient: Monica Jacobs  Procedure(s) Performed: CESAREAN SECTION (N/A Abdomen)  Patient Location: PACU  Anesthesia Type:Spinal  Level of Consciousness: awake, alert  and oriented  Airway & Oxygen Therapy: Patient Spontanous Breathing  Post-op Assessment: Report given to RN and Post -op Vital signs reviewed and stable  Post vital signs: Reviewed and stable  Last Vitals:  Vitals Value Taken Time  BP 124/69 07/27/19 1633  Temp    Pulse 64 07/27/19 1635  Resp 14 07/27/19 1635  SpO2 97 % 07/27/19 1635  Vitals shown include unvalidated device data.  Last Pain:  Vitals:   07/27/19 1329  TempSrc: Oral  PainSc: 0-No pain      Patients Stated Pain Goal: 4 ([id number])  Complications: No apparent anesthesia complications

## 2019-07-27 NOTE — Anesthesia Procedure Notes (Signed)
Spinal  Patient location during procedure: OR Start time: 07/27/2019 3:23 PM End time: 07/27/2019 3:30 PM Staffing Other anesthesia staff: Estill Batten, RN Performed: other anesthesia staff  Preanesthetic Checklist Completed: patient identified, site marked, surgical consent, pre-op evaluation, timeout performed, IV checked, risks and benefits discussed and monitors and equipment checked Spinal Block Patient position: sitting Prep: ChloraPrep, site prepped and draped and DuraPrep Patient monitoring: cardiac monitor, continuous pulse ox and blood pressure Approach: midline Location: L3-4 Injection technique: single-shot Needle Needle type: Pencan  Needle gauge: 27 G Needle length: 9 cm Catheter type: closed end Assessment Sensory level: T4

## 2019-07-28 ENCOUNTER — Encounter (HOSPITAL_COMMUNITY): Payer: Self-pay | Admitting: Obstetrics & Gynecology

## 2019-07-28 LAB — CBC
HCT: 31 % — ABNORMAL LOW (ref 36.0–46.0)
Hemoglobin: 10.1 g/dL — ABNORMAL LOW (ref 12.0–15.0)
MCH: 30.4 pg (ref 26.0–34.0)
MCHC: 32.6 g/dL (ref 30.0–36.0)
MCV: 93.4 fL (ref 80.0–100.0)
Platelets: 117 10*3/uL — ABNORMAL LOW (ref 150–400)
RBC: 3.32 MIL/uL — ABNORMAL LOW (ref 3.87–5.11)
RDW: 13.8 % (ref 11.5–15.5)
WBC: 12.6 10*3/uL — ABNORMAL HIGH (ref 4.0–10.5)
nRBC: 0 % (ref 0.0–0.2)

## 2019-07-28 NOTE — Progress Notes (Signed)
MOB was referred for history of anorexia and depression.  * Referral screened out by Clinical Social Worker because none of the following criteria appear to apply:  ~History of anorexia and depression during this pregnancy, or of post-partum depression following prior delivery. ~Diagnosis of anorexia and depression within last 3 years. Per chart review, MOB had history of anorexia and depression noted in chart in 2012 but no concerns noted since.  OR * MOB's symptoms currently being treated with medication and/or therapy.  Please contact the Clinical Social Worker if needs arise or by MOB request.  Elijio Miles, [place]  [facility] [employer] [phone]

## 2019-07-28 NOTE — Lactation Note (Signed)
This note was copied from a baby's chart. Lactation Consultation Note  Patient Name: Monica Jacobs [id number] Date: 07/28/2019 Reason for consult: Follow-up assessment;Mother's request;Difficult latch;Primapara;1st time breastfeeding;Term  1708 - 1758 - I followed up with Ms. Quincy Sheehan. She reports that baby Jeral Fruit is latching better today than yesterday. She states that he latches best with the size 20 nipple shield, and she feels that the size 20 is the best fit.  Her concern, with her support person, is that baby is not transferring. She sees what appears to be baby's saliva in the NS but no breast milk. We reviewed hand expression, and I did not observe any colostrum after a few minutes of massage.   The RN set up a DEBP, and Ms. Mareno has initiated breast pumping.  I discussed options with parents and the RN assigned to the room, and we agreed that at 26 hours, supplementation is appropriate. I provided supplementation via curved tip syringe in the nipple shield. Baby transferred about 3-4 mls in this fashion with good rhythmic suckling sequences.  Afterwards, I showed Ms. Risden support person how to cup feed baby, and he fed an additional 4-5 mls, for a total of about 8 mls.  Ms. Archacki would like to avoid a bottle nipple if possible. Her hope is that baby will transfer adequate amounts of milk at the breast as her milk transitions.   Baby went to sleep and refused additional milk.  We discussed day two supplementation volumes.  PLAN:  Feed baby 8-12 times a day on demand. Wake to feed as needed. Start baby at the breast with supplementation in the nipple shield. Reinforce baby at the breast with curved tip syringe. Follow breast feed with additional supplementation by cup. Support person to cup feed, while Ms. Shyrl Numbers pumps. Pump 8 times a day.  Follow up tomorrow to adapt plan. Day 2 education provided.  I also agreed to put in a referral for an OP appointment.  Baby is  doing better at the breast today, and I provided lots of praise and encouragement for both caregivers' efforts.   Maternal Data Formula Feeding for Exclusion: No Has patient been taught Hand Expression?: Yes Does the patient have breastfeeding experience prior to this delivery?: No  Feeding Feeding Type: Breast Milk with Formula added  LATCH Score Latch: Grasps breast easily, tongue down, lips flanged, rhythmical sucking.  Audible Swallowing: A few with stimulation  Type of Nipple: Flat  Comfort (Breast/Nipple): Soft / non-tender  Hold (Positioning): Assistance needed to correctly position infant at breast and maintain latch.  LATCH Score: 7  Interventions Interventions: Breast feeding basics reviewed;Assisted with latch;Adjust position;Skin to skin;Hand express  Lactation Tools Discussed/Used Tools: Nipple [place] [employer];Feeding cup;79F feeding tube / Syringe;Other (comment)(curved tip syringe) Nipple shield size: 20 Breast pump type: Double-Electric Breast Pump Pump Review: Setup, frequency, and cleaning Initiated by:: Al Corpus Date initiated:: 07/28/19   Consult Status Consult Status: Follow-up Date: 07/29/19 Follow-up type: In-patient    Lenore Manner 07/28/2019, 6:54 PM

## 2019-07-28 NOTE — Progress Notes (Signed)
Subjective: [name] reports tolerating PO.   Baby boy "Atlas" Objective: Vital signs in last 24 hours: Temp:  [97.5 F (36.4 C)-98.8 F (37.1 C)] 98 F (36.7 C) (10/02 0540) Pulse Rate:  [53-88] 70 (10/02 0540) Resp:  [11-25] 18 (10/02 0540) BP: (86-141)/(58-95) 105/70 (10/02 0540) SpO2:  [94 %-100 %] 99 % (10/02 0540) Weight:  [87.1 kg] 87.1 kg (10/01 1329)  Physical Exam:  General: alert, cooperative and no distress Lochia: appropriate Uterine Fundus: firm Incision: healing well DVT Evaluation: No evidence of DVT seen on physical exam.  Recent Labs    07/25/19 1004  HGB 12.7  HCT 36.9    Assessment/Plan: Status post Cesarean section. Doing well postoperatively.  Continue current care.  Monica Jacobs 07/28/2019, 7:29 AM

## 2019-07-28 NOTE — Anesthesia Postprocedure Evaluation (Signed)
Anesthesia Post Note  Patient: Monica Jacobs  Procedure(s) Performed: CESAREAN SECTION (N/A Abdomen)     Patient location during evaluation: PACU Anesthesia Type: Spinal Level of consciousness: awake and alert Pain management: pain level controlled Vital Signs Assessment: post-procedure vital signs reviewed and stable Respiratory status: spontaneous breathing and respiratory function stable Cardiovascular status: blood pressure returned to baseline and stable Postop Assessment: no headache, no backache, spinal receding and no apparent nausea or vomiting Anesthetic complications: no    Last Vitals:  Vitals:   07/28/19 0815 07/28/19 1145  BP: (!) 96/56 (!) 107/51  Pulse: (!) 57 61  Resp: 20 18  Temp: 36.6 C 37 C  SpO2:  99%    Last Pain:  Vitals:   07/28/19 1148  TempSrc:   PainSc: 4                  Montez Hageman

## 2019-07-29 MED ORDER — IBUPROFEN 600 MG PO TABS: 600.0000 mg | ORAL_TABLET | Freq: Four times a day (QID) | ORAL | 0 refills | Status: DC | PRN

## 2019-07-29 MED ORDER — ACETAMINOPHEN 325 MG PO TABS: 650.0000 mg | ORAL_TABLET | Freq: Four times a day (QID) | ORAL | 0 refills | Status: DC | PRN

## 2019-07-29 NOTE — Discharge Summary (Signed)
Obstetric Discharge Summary Reason for Admission: cesarean section Prenatal Procedures: none Intrapartum Procedures: cesarean: low cervical, transverse Postpartum Procedures: none Complications-Operative and Postpartum: none Hemoglobin  Date Value Ref Range Status  07/28/2019 10.1 (L) 12.0 - 15.0 g/dL Final   HCT  Date Value Ref Range Status  07/28/2019 31.0 (L) 36.0 - 46.0 % Final    Physical Exam:  General: alert, cooperative and no distress Lochia: appropriate Uterine Fundus: firm Incision: healing well DVT Evaluation: No evidence of DVT seen on physical exam.  Discharge Diagnoses: Term Pregnancy-delivered  Discharge Information: Date: 07/29/2019 Activity: pelvic rest Diet: routine Medications: PNV, Ibuprofen and Tylenol Condition: stable Instructions: refer to practice specific booklet Discharge to: home   Newborn Data: Live born female  Birth Weight: 7 lb 2.1 oz (3235 g) APGAR: 55, 9  Newborn Delivery   Birth date/time: 07/27/2021 15:48:00 Delivery type: C-Section, Low Transverse Trial of labor: No C-section categorization: Primary      Home with mother.  Shon Millet II 07/29/2019, 7:55 AM

## 2019-07-29 NOTE — Lactation Note (Signed)
This note was copied from a baby's chart. Lactation Consultation Note  Patient Name: Monica Jacobs [id number] Date: 07/29/2019 Reason for consult: Primapara;1st time breastfeeding;Term;Follow-up assessment;Infant weight loss  32 hours old FT female who is being partially BF and formula fed by his mother, she's a P1. Parents won't do any bottles or pacifiers, they're going home today and still feeding baby with a NS # 20 and a curved tip syringe, baby is at 6% weight loss.    Mom not using her shells because they're never taken out of the bag, LC opened them, instructions, cleaning and storage were reviewed. Assisted mom with hand expression but only pin size droplets/glistering observed on left breast, non on the right one. She reported (+) breast changes during the pregnancy. Mom has flat nipples and her tissue is not very compressible; but she's been pumping every 3-4 hours; sometimes longer.  Stressed to mom the importance of pumping consistently for the onset of lactogenesis II and when supplementing with formula in order to protect her supply. Offered assistance with latch and mom agreed to wake baby up to feed. Baby would slip off every time, hard for baby to stay on without the NS, dad has already packed and took it to the car, parents are ready to leave the hospital but will call to schedule an [place] OP consultation.  Reviewed engorgement prevention/treatment, treatment/prevention for sore nipples, red flags on when to call baby's pediatrician, pumping schedule and supplementation guidelines according to baby's age in hours. Parents understand that starting on day 3 baby will need more volume and that it will be challenging to meet those volumes with syringe feeding.   Parents plan on doing cup feeding once baby starts taking an ounce, LC also suggested they could try a bottle with a slow flow nipple if it's too challenging to meet the volumes required for supplementation until mother's milk comes  in and baby can start transferring at the breast.  Parents reported all questions and concerns were answered, they're both aware of [place] OP services and will be calling to schedule an appt with lactation for follow up.  Maternal Data    Feeding Feeding Type: Breast Fed  LATCH Score                   Interventions Interventions: Breast feeding basics reviewed;Assisted with latch;Skin to skin;Breast massage;Hand express;Breast compression;Support pillows;Adjust position;Shells  Lactation Tools Discussed/Used Tools: Shells Shell Type: Inverted   Consult Status Consult Status: Complete Date: 07/29/19 Follow-up type: [place] 07/29/2019, 11:12 AM

## 2020-01-29 ENCOUNTER — Encounter: Payer: Self-pay | Admitting: Physician Assistant

## 2020-01-29 ENCOUNTER — Other Ambulatory Visit: Payer: Self-pay

## 2020-01-29 ENCOUNTER — Ambulatory Visit (INDEPENDENT_AMBULATORY_CARE_PROVIDER_SITE_OTHER): Payer: No Typology Code available for payment source | Admitting: Physician Assistant

## 2020-01-29 VITALS — BP 110/60 | HR 58 | Temp 98.3°F | Ht 64.0 in | Wt 168.2 lb

## 2020-01-29 DIAGNOSIS — Z13220 Encounter for screening for lipoid disorders: Secondary | ICD-10-CM | POA: Diagnosis not present

## 2020-01-29 DIAGNOSIS — Z136 Encounter for screening for cardiovascular disorders: Secondary | ICD-10-CM

## 2020-01-29 DIAGNOSIS — J310 Chronic rhinitis: Secondary | ICD-10-CM | POA: Diagnosis not present

## 2020-01-29 DIAGNOSIS — K5900 Constipation, unspecified: Secondary | ICD-10-CM

## 2020-01-29 DIAGNOSIS — R41840 Attention and concentration deficit: Secondary | ICD-10-CM

## 2020-01-29 DIAGNOSIS — Z0001 Encounter for general adult medical examination with abnormal findings: Secondary | ICD-10-CM

## 2020-01-29 DIAGNOSIS — M542 Cervicalgia: Secondary | ICD-10-CM | POA: Diagnosis not present

## 2020-01-29 DIAGNOSIS — N912 Amenorrhea, unspecified: Secondary | ICD-10-CM

## 2020-01-29 DIAGNOSIS — F419 Anxiety disorder, unspecified: Secondary | ICD-10-CM

## 2020-01-29 DIAGNOSIS — E663 Overweight: Secondary | ICD-10-CM

## 2020-01-29 LAB — CBC WITH DIFFERENTIAL/PLATELET
Basophils Absolute: 0 10*3/uL (ref 0.0–0.1)
Basophils Relative: 0.6 % (ref 0.0–3.0)
Eosinophils Absolute: 0.1 10*3/uL (ref 0.0–0.7)
Eosinophils Relative: 1.2 % (ref 0.0–5.0)
HCT: 41.2 % (ref 36.0–46.0)
Hemoglobin: 13.6 g/dL (ref 12.0–15.0)
Lymphocytes Relative: 28.1 % (ref 12.0–46.0)
Lymphs Abs: 1.8 10*3/uL (ref 0.7–4.0)
MCHC: 33.1 g/dL (ref 30.0–36.0)
MCV: 89.4 fl (ref 78.0–100.0)
Monocytes Absolute: 0.5 10*3/uL (ref 0.1–1.0)
Monocytes Relative: 8.3 % (ref 3.0–12.0)
Neutro Abs: 4 10*3/uL (ref 1.4–7.7)
Neutrophils Relative %: 61.8 % (ref 43.0–77.0)
Platelets: 178 10*3/uL (ref 150.0–400.0)
RBC: 4.61 Mil/uL (ref 3.87–5.11)
RDW: 14.3 % (ref 11.5–15.5)
WBC: 6.5 10*3/uL (ref 4.0–10.5)

## 2020-01-29 LAB — COMPREHENSIVE METABOLIC PANEL
ALT: 17 U/L (ref 0–35)
AST: 18 U/L (ref 0–37)
Albumin: 4.5 g/dL (ref 3.5–5.2)
Alkaline Phosphatase: 65 U/L (ref 39–117)
BUN: 10 mg/dL (ref 6–23)
CO2: 28 mEq/L (ref 19–32)
Calcium: 9.6 mg/dL (ref 8.4–10.5)
Chloride: 103 mEq/L (ref 96–112)
Creatinine, Ser: 0.77 mg/dL (ref 0.40–1.20)
GFR: 87.44 mL/min (ref >60.00)
Glucose, Bld: 85 mg/dL (ref 70–99)
Potassium: 4.1 mEq/L (ref 3.5–5.1)
Sodium: 140 mEq/L (ref 135–145)
Total Bilirubin: 0.5 mg/dL (ref 0.2–1.2)
Total Protein: 7 g/dL (ref 6.0–8.3)

## 2020-01-29 LAB — POCT URINE PREGNANCY: Preg Test, Ur: NEGATIVE

## 2020-01-29 LAB — LIPID PANEL
Cholesterol: 162 mg/dL (ref 0–200)
HDL: 56.3 mg/dL (ref >39.00)
LDL Cholesterol: 95 mg/dL (ref 0–99)
NonHDL: 106.16
Total CHOL/HDL Ratio: 3
Triglycerides: 56 mg/dL (ref 0.0–149.0)
VLDL: 11.2 mg/dL (ref 0.0–40.0)

## 2020-01-29 NOTE — Patient Instructions (Addendum)
It was great to see you!  Please go to the lab for blood work.   Our office will call you with your results unless you have chosen to receive results via MyChart.  If your blood work is normal we will follow-up each year for physicals and as scheduled for chronic medical problems.  If anything is abnormal we will treat accordingly and get you in for a follow-up.  Take care,  Aldona Bar  -- Follow-up with Dr. Paulla Fore at [facility] if your back pain persists -- Start Flonase for your sinuses -- Metamucil daily for fiber, and Miralax as needed for laxative use -- You will be contacted about scheduling with a therapist and also for evaluation of ADHD    Health Maintenance, Female Adopting a healthy lifestyle and getting preventive care are important in promoting health and wellness. Ask your health care provider about:  The right schedule for you to have regular tests and exams.  Things you can do on your own to prevent diseases and keep yourself healthy. What should I know about diet, weight, and exercise? Eat a healthy diet   Eat a diet that includes plenty of vegetables, fruits, low-fat dairy products, and lean protein.  Do not eat a lot of foods that are high in solid fats, added sugars, or sodium. Maintain a healthy weight Body mass index (BMI) is used to identify weight problems. It estimates body fat based on height and weight. Your health care provider can help determine your BMI and help you achieve or maintain a healthy weight. Get regular exercise Get regular exercise. This is one of the most important things you can do for your health. Most adults should:  Exercise for at least 150 minutes each week. The exercise should increase your heart rate and make you sweat (moderate-intensity exercise).  Do strengthening exercises at least twice a week. This is in addition to the moderate-intensity exercise.  Spend less time sitting. Even light physical activity can be  beneficial. Watch cholesterol and blood lipids Have your blood tested for lipids and cholesterol at 31 years of age, then have this test every 5 years. Have your cholesterol levels checked more often if:  Your lipid or cholesterol levels are high.  You are older than 31 years of age.  You are at high risk for heart disease. What should I know about cancer screening? Depending on your health history and family history, you may need to have cancer screening at various ages. This may include screening for:  Breast cancer.  Cervical cancer.  Colorectal cancer.  Skin cancer.  Lung cancer. What should I know about heart disease, diabetes, and high blood pressure? Blood pressure and heart disease  High blood pressure causes heart disease and increases the risk of stroke. This is more likely to develop in people who have high blood pressure readings, are of African descent, or are overweight.  Have your blood pressure checked: ? Every 3-5 years if you are 90-97 years of age. ? Every year if you are 32 years old or older. Diabetes Have regular diabetes screenings. This checks your fasting blood sugar level. Have the screening done:  Once every three years after age 70 if you are at a normal weight and have a low risk for diabetes.  More often and at a younger age if you are overweight or have a high risk for diabetes. What should I know about preventing infection? Hepatitis B If you have a higher risk for hepatitis B,  you should be screened for this virus. Talk with your health care provider to find out if you are at risk for hepatitis B infection. Hepatitis C Testing is recommended for:  Everyone born from [birth year] through 1967.  Anyone with known risk factors for hepatitis C. Sexually transmitted infections (STIs)  Get screened for STIs, including gonorrhea and chlamydia, if: ? You are sexually active and are younger than 31 years of age. ? You are older than 31 years of age and  your health care provider tells you that you are at risk for this type of infection. ? Your sexual activity has changed since you were last screened, and you are at increased risk for chlamydia or gonorrhea. Ask your health care provider if you are at risk.  Ask your health care provider about whether you are at high risk for HIV. Your health care provider may recommend a prescription medicine to help prevent HIV infection. If you choose to take medicine to prevent HIV, you should first get tested for HIV. You should then be tested every 3 months for as long as you are taking the medicine. Pregnancy  If you are about to stop having your period (premenopausal) and you may become pregnant, seek counseling before you get pregnant.  Take 400 to 800 micrograms (mcg) of folic acid every day if you become pregnant.  Ask for birth control (contraception) if you want to prevent pregnancy. Osteoporosis and menopause Osteoporosis is a disease in which the bones lose minerals and strength with aging. This can result in bone fractures. If you are 15 years old or older, or if you are at risk for osteoporosis and fractures, ask your health care provider if you should:  Be screened for bone loss.  Take a calcium or vitamin D supplement to lower your risk of fractures.  Be given hormone replacement therapy (HRT) to treat symptoms of menopause. Follow these instructions at home: Lifestyle  Do not use any products that contain nicotine or tobacco, such as cigarettes, e-cigarettes, and chewing tobacco. If you need help quitting, ask your health care provider.  Do not use street drugs.  Do not share needles.  Ask your health care provider for help if you need support or information about quitting drugs. Alcohol use  Do not drink alcohol if: ? Your health care provider tells you not to drink. ? You are pregnant, may be pregnant, or are planning to become pregnant.  If you drink alcohol: ? Limit how  much you use to 0-1 drink a day. ? Limit intake if you are breastfeeding.  Be aware of how much alcohol is in your drink. In the [place] one drink equals one 12 oz bottle of beer (355 mL), one 5 oz glass of wine (148 mL), or one 1 oz glass of hard liquor (44 mL). General instructions  Schedule regular health, dental, and eye exams.  Stay current with your vaccines.  Tell your health care provider if: ? You often feel depressed. ? You have ever been abused or do not feel safe at home. Summary  Adopting a healthy lifestyle and getting preventive care are important in promoting health and wellness.  Follow your health care provider's instructions about healthy diet, exercising, and getting tested or screened for diseases.  Follow your health care provider's instructions on monitoring your cholesterol and blood pressure. This information is not intended to replace advice given to you by your health care provider. Make sure you discuss any questions you have  with your health care provider. Document Revised: 10/05/2018 Document Reviewed: 10/05/2018 Elsevier Patient Education  2020 [employer].

## 2020-01-29 NOTE — Progress Notes (Signed)
I acted as a Education administrator for [employer], PA-C Anselmo Pickler, LPN  Subjective:    Monica Jacobs is a 29 y.o. female and is here for a comprehensive physical exam.  HPI  Health Maintenance Due  Topic Date Due  . PAP SMEAR-Modifier  01/25/2019    Acute Concerns: Irregular periods - currently breastfeeding. Is sexually active with her husband. Patient's last menstrual period was 12/18/2019. Feels like she might start her period soon. Per chart review, had irregular periods prior to pregnancy. Does have ob-gyn at [facility]. Cervical pain -- chronic. Pain is in the R side of her neck, near her shoulder blade. Has dealt with this for a few years, and has been seeing Dr. Maximiano Coss at [place]. Gets an intermittent numbness/tingling sensation in this area but it doesn't travel to her R arm or affect grip strength. Has had negative imaging. She has also had dry needling done there as well which she has found to be helpful.  Plans to possibly see Dr. Paulla Fore at [facility] if symptoms do not continue to improve. She is currently breastfeeding and she feels like this is not helping things. Rhinitis -- nose always feels congested. Not seasonal. Doesn't take anything for her symptoms. Will occasionally take oral benadryl if allergies are really bothering her. Denies PND/cough. Constipation -- has ongoing issues with constipation. She feels like she doesn't have frequent enough BMs. She does admit to comparing her GI tract to her husbands. She tried to eat more fiber but didn't feel like that was helpful. Does go on daily walks and recently has worked on water intake. Takes an occasional dulcolax for her symptoms. Denies rectal bleeding or chronic abdominal pain.  ADD -- history of ADD per patient that was never diagnosed by a psychiatrist. Was on medication when she was 29 years old. Strong family history of ADHD. Currently having difficulty with focusing and staying on top of getting  things done, juggling her daily work responsibilities and chores. Anxiety -- recently starting having spells where she feels a knot in the middle of her stomach and gets the sensation of anxiety but with no known trigger of her symptoms.  Chronic Issues: None  Health Maintenance: Immunizations -- UTD Colonoscopy -- N/A Mammogram -- N/A PAP -- UTD per pt will get result Bone Density -- N/A Diet -- avoids soda, eats all food groups, tries to eat more plant based diet Caffeine intake -- 1 coffee daily Sleep habits -- sleeps okay and is a light sleeper since having her baby Exercise -- tries to get scheduled exercise; goes on regular daily walks Current Weight -- Weight: 168 lb 4 oz (76.3 kg) -- would like to get to 155 lb Weight History: Wt Readings from Last 10 Encounters:  01/29/20 168 lb 4 oz (76.3 kg)  07/27/19 192 lb (87.1 kg)  07/20/19 192 lb (87.1 kg)  03/02/17 162 lb 14.4 oz (73.9 kg)   Mood -- overall okay Patient's last menstrual period was 12/18/2019. Period characteristics -- irregular Birth control -- none  Depression screen PHQ 2/9 01/29/2020  Decreased Interest 0  Down, Depressed, Hopeless 0  PHQ - 2 Score 0     Other providers/specialists: Patient Care Team: Inda Coke, [place] as PCP - General (Physician Assistant)   PMHx, SurgHx, SocialHx, Medications, and Allergies were reviewed in the Visit Navigator and updated as appropriate.   Past Medical History:  Diagnosis Date  . Anorexia   . Depression   . Environmental allergies  Past Surgical History:  Procedure Laterality Date  . CESAREAN SECTION N/A 07/27/2019   Procedure: CESAREAN SECTION;  Surgeon: Linda Hedges, DO;  Location: [facility] LD ORS;  Service: Obstetrics;  Laterality: N/A;  Primary edc 08/03/19 NKDA Tracey RNFA  . NO PAST SURGERIES       Family History  Problem Relation Age of Onset  . Anxiety disorder Mother   . Hypercholesterolemia Mother   . ADD / ADHD Mother   . Cancer Father          skin  . Heart attack Maternal Grandmother   . Mental illness Sister   . Diabetes Maternal Grandfather   . Heart disease Maternal Grandfather   . Colon cancer Neg Hx   . Breast cancer Neg Hx   . Prostate cancer Neg Hx     Social History   Tobacco Use  . Smoking status: Never Smoker  . Smokeless tobacco: Never Used  Substance Use Topics  . Alcohol use: Yes    Comment: Occasionally  . Drug use: No    Review of Systems:   Review of Systems  Constitutional: Negative for chills, fever, malaise/fatigue and weight loss.  HENT: Negative for hearing loss, sinus pain and sore throat.   Respiratory: Negative for cough and hemoptysis.   Cardiovascular: Negative for chest pain, palpitations, leg swelling and PND.  Gastrointestinal: Positive for constipation. Negative for abdominal pain, diarrhea, heartburn, nausea and vomiting.  Genitourinary: Negative for dysuria, frequency and urgency.  Musculoskeletal: Positive for neck pain. Negative for back pain and myalgias.  Skin: Negative for itching and rash.  Neurological: Negative for dizziness, tingling, seizures and headaches.  Endo/Heme/Allergies: Negative for polydipsia.  Psychiatric/Behavioral: Negative for depression. The patient does not have insomnia.       Objective:   BP 110/60 (BP Location: Left Arm, Patient Position: Sitting, Cuff Size: Normal)   Pulse (!) 58   Temp 98.3 F (36.8 C) (Temporal)   Ht 5\' 4"  (1.626 m)   Wt 168 lb 4 oz (76.3 kg)   LMP 12/18/2019   SpO2 99%   Breastfeeding Yes   BMI 28.88 kg/m   General Appearance:    Alert, cooperative, no distress, appears stated age  Head:    Normocephalic, without obvious abnormality, atraumatic  Eyes:    PERRL, conjunctiva/corneas clear, EOM's intact, fundi    benign, both eyes  Ears:    Normal TM's and external ear canals, both ears  Nose:   Nares normal, septum midline, mucosa normal, no drainage    or sinus tenderness  Throat:   Lips, mucosa, and tongue  normal; teeth and gums normal  Neck:   Supple, symmetrical, trachea midline, no adenopathy;    thyroid:  no enlargement/tenderness/nodules; no carotid   bruit or JVD  Back:     Symmetric, no curvature, ROM normal, no CVA tenderness  Lungs:     Clear to auscultation bilaterally, respirations unlabored  Chest Wall:    No tenderness or deformity   Heart:    Regular rate and rhythm, S1 and S2 normal, no murmur, rub   or gallop  Breast Exam:    Deferred  Abdomen:     Soft, non-tender, bowel sounds active all four quadrants,    no masses, no organomegaly  Genitalia:    Deferred  Rectal:    Deferred  Extremities:   Extremities normal, atraumatic, no cyanosis or edema  Pulses:   2+ and symmetric all extremities  Skin:   Skin color, texture, turgor normal,  no rashes or lesions  Lymph nodes:   Cervical, supraclavicular, and axillary nodes normal  Neurologic:   CNII-XII intact, normal strength, sensation and reflexes    throughout   Results for orders placed or performed in visit on 01/29/20  POCT urine pregnancy  Result Value Ref Range   Preg Test, Ur Negative Negative    Assessment/Plan:   Mame was seen today for annual exam.  Diagnoses and all orders for this visit:  Encounter for general adult medical examination with abnormal findings Today patient counseled on age appropriate routine health concerns for screening and prevention, each reviewed and up to date or declined. Immunizations reviewed and up to date or declined. Labs ordered and reviewed. Risk factors for depression reviewed and negative. Hearing function and visual acuity are intact. ADLs screened and addressed as needed. Functional ability and level of safety reviewed and appropriate. Education, counseling and referrals performed based on assessed risks today. Patient provided with a copy of personalized plan for preventive services.  Amenorrhea Urine pregnancy test negative. Further work-up per ob-gyn. -     POCT urine  pregnancy -     CBC with Differential/Platelet -     Comprehensive metabolic panel  Cervical pain Recommend follow-up with Dr. Paulla Fore if symptoms persist despite further dry needling.  Chronic rhinitis Trial flonase as needed, further intervention as needed.  Constipation, unspecified constipation type Discussed use of miralax prn. No red flags on exam. Provided constipation handout -- work on diet, exercise, fluids, fiber.  Attention or concentration deficit Will refer to [facility] for further evaluation and management. -     Ambulatory referral to Psychiatry  Encounter for lipid screening for cardiovascular disease -     Lipid panel  Overweight Encouraged healthy eating and ongoing exercise.  Anxiety Referral for therapist placed. Recommend follow-up as needed for further evaluation and management. Denies SI/HI. -     Ambulatory referral to Psychology  Well Adult Exam: Labs ordered: Yes. Patient counseling was done. See below for items discussed. Discussed the patient's BMI. The BMI is not in the acceptable range; BMI management plan is completed Follow up as needed for acute illness.  Patient Counseling:   [x]     Nutrition: Stressed importance of moderation in sodium/caffeine intake, saturated fat and cholesterol, caloric balance, sufficient intake of fresh fruits, vegetables, fiber, calcium, iron, and 1 mg of folate supplement per day (for females capable of pregnancy).   [x]      Stressed the importance of regular exercise.    [x]     Substance Abuse: Discussed cessation/primary prevention of tobacco, alcohol, or other drug use; driving or other dangerous activities under the influence; availability of treatment for abuse.    [x]      Injury prevention: Discussed safety belts, safety helmets, smoke detector, smoking near bedding or upholstery.    [x]      Sexuality: Discussed sexually transmitted diseases, partner selection, use of condoms, avoidance  of unintended pregnancy  and contraceptive alternatives.    [x]     Dental health: Discussed importance of regular tooth brushing, flossing, and dental visits.   [x]      Health maintenance and immunizations reviewed. Please refer to Health maintenance section.   CMA or LPN served as scribe during this visit. History, Physical, and Plan performed by medical provider. The above documentation has been reviewed and is accurate and complete.  I spent 65 minutes with this patient, of which 45 minutes were spent on discussing her acute issues including: concerns for  ADHD, constipation, rhinitis, cervical pain.  Inda Coke, PA-C [place]

## 2020-05-15 ENCOUNTER — Ambulatory Visit (INDEPENDENT_AMBULATORY_CARE_PROVIDER_SITE_OTHER): Payer: No Typology Code available for payment source | Admitting: Psychology

## 2020-05-15 DIAGNOSIS — F411 Generalized anxiety disorder: Secondary | ICD-10-CM | POA: Diagnosis not present

## 2020-06-03 ENCOUNTER — Ambulatory Visit (INDEPENDENT_AMBULATORY_CARE_PROVIDER_SITE_OTHER): Payer: No Typology Code available for payment source | Admitting: Psychology

## 2020-06-03 DIAGNOSIS — F411 Generalized anxiety disorder: Secondary | ICD-10-CM

## 2020-06-18 ENCOUNTER — Ambulatory Visit (INDEPENDENT_AMBULATORY_CARE_PROVIDER_SITE_OTHER): Payer: No Typology Code available for payment source | Admitting: Psychology

## 2020-06-18 DIAGNOSIS — F411 Generalized anxiety disorder: Secondary | ICD-10-CM

## 2020-07-03 ENCOUNTER — Ambulatory Visit (INDEPENDENT_AMBULATORY_CARE_PROVIDER_SITE_OTHER): Payer: No Typology Code available for payment source | Admitting: Psychology

## 2020-07-03 DIAGNOSIS — F411 Generalized anxiety disorder: Secondary | ICD-10-CM

## 2020-07-24 ENCOUNTER — Ambulatory Visit (INDEPENDENT_AMBULATORY_CARE_PROVIDER_SITE_OTHER): Payer: No Typology Code available for payment source | Admitting: Psychology

## 2020-07-24 DIAGNOSIS — F411 Generalized anxiety disorder: Secondary | ICD-10-CM

## 2020-08-13 ENCOUNTER — Ambulatory Visit: Payer: No Typology Code available for payment source | Admitting: Psychology

## 2020-08-26 ENCOUNTER — Ambulatory Visit: Payer: No Typology Code available for payment source | Admitting: Psychology

## 2020-11-28 DIAGNOSIS — N926 Irregular menstruation, unspecified: Secondary | ICD-10-CM | POA: Diagnosis not present

## 2020-12-03 ENCOUNTER — Other Ambulatory Visit: Payer: Self-pay | Admitting: Obstetrics & Gynecology

## 2020-12-03 DIAGNOSIS — R947 Abnormal results of other endocrine function studies: Secondary | ICD-10-CM

## 2020-12-24 ENCOUNTER — Ambulatory Visit
Admission: RE | Admit: 2020-12-24 | Discharge: 2020-12-24 | Disposition: A | Discharge status: Home / Self Care | Payer: BC Managed Care – PPO | Source: Ambulatory Visit | Attending: Obstetrics & Gynecology | Admitting: Obstetrics & Gynecology

## 2020-12-24 ENCOUNTER — Other Ambulatory Visit: Payer: Self-pay

## 2020-12-24 DIAGNOSIS — G9389 Other specified disorders of brain: Secondary | ICD-10-CM | POA: Diagnosis not present

## 2020-12-24 DIAGNOSIS — E237 Disorder of pituitary gland, unspecified: Secondary | ICD-10-CM | POA: Diagnosis not present

## 2020-12-24 DIAGNOSIS — R947 Abnormal results of other endocrine function studies: Secondary | ICD-10-CM | POA: Diagnosis not present

## 2020-12-24 IMAGING — MR MR HEAD WO/W CM
17 of 19 series · 37 of 48 positions shown · IV contrast (multihance)
Comparison: None.

CLINICAL DATA: Abnormal results of endocrine function.

EXAM:
MRI HEAD WITHOUT AND WITH CONTRAST
TECHNIQUE: Multiplanar, multiecho pulse sequences of the brain and surrounding
structures were obtained without and with intravenous contrast.
CONTRAST:  7mL MULTIHANCE GADOBENATE DIMEGLUMINE 529 MG/ML IV SOLN

[Series 2: T1 · sagittal · 5.0mm · 0.45mm/px · 1 of 22 slices shown]
[im 1/22]
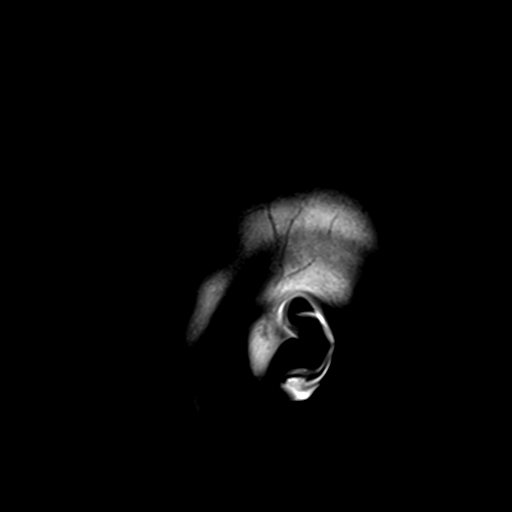

[Series 3: DWI · axial · 3.0mm · 1.80mm/px · z∈[-50,+96]mm · 11 of 100 slices shown]
[im 1/100]
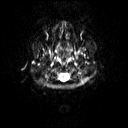
[im 10/100]
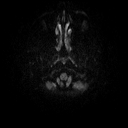
[im 20/100]
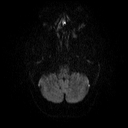
[im 30/100]
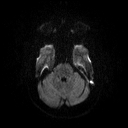
[im 40/100]
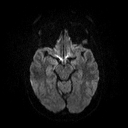
[im 50/100]
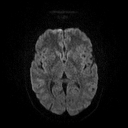
[im 60/100]
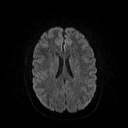
[im 70/100]
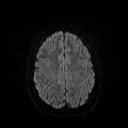
[im 80/100]
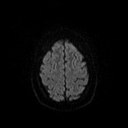
[im 90/100]
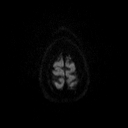
[im 100/100]
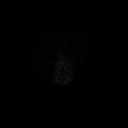

[Series 4: ax dwi_adc · axial · 3.0mm · 1.80mm/px · z∈[-50,+96]mm · 5 of 48 slices shown]
[im 1/48]
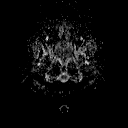
[im 12/48]
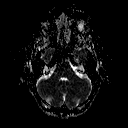
[im 24/48]
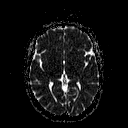
[im 36/48]
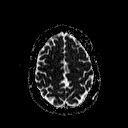
[im 48/48]
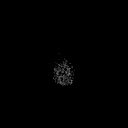

[Series 5: T2 · axial · 5.0mm · 0.36mm/px · z∈[-45,+90]mm · 2 of 22 slices shown]
[im 1/22]
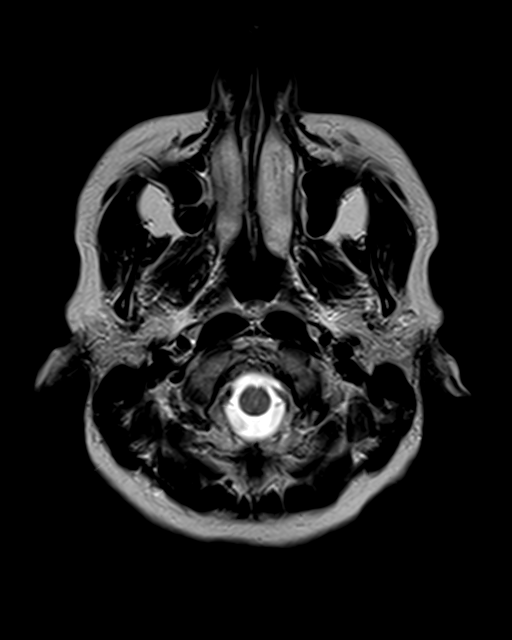
[im 22/22]
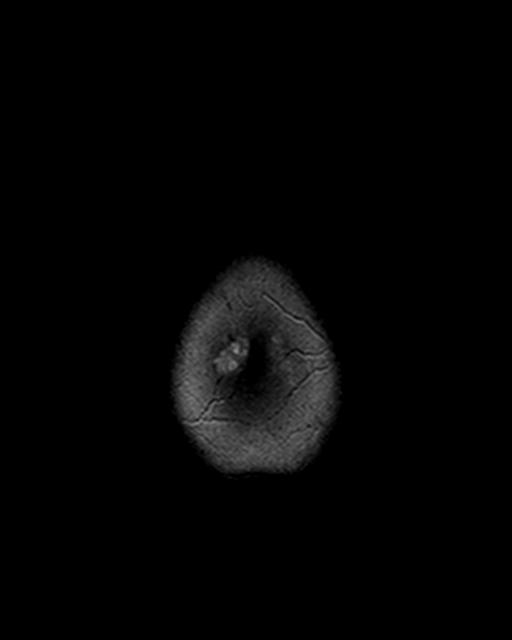

[Series 6: FLAIR · axial · 3.0mm · 0.45mm/px · z∈[-44,+90]mm · 3 of 30 slices shown]
[im 1/30]
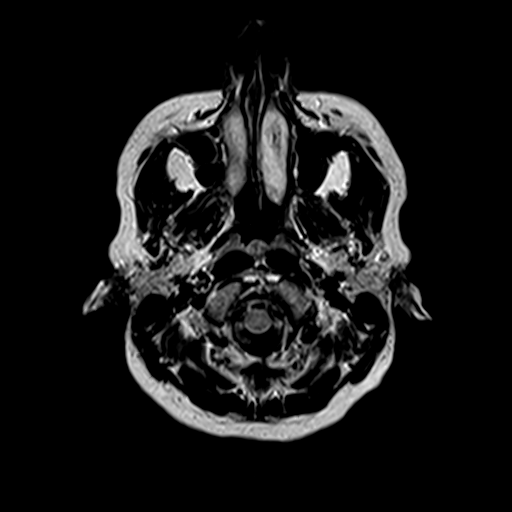
[im 15/30]
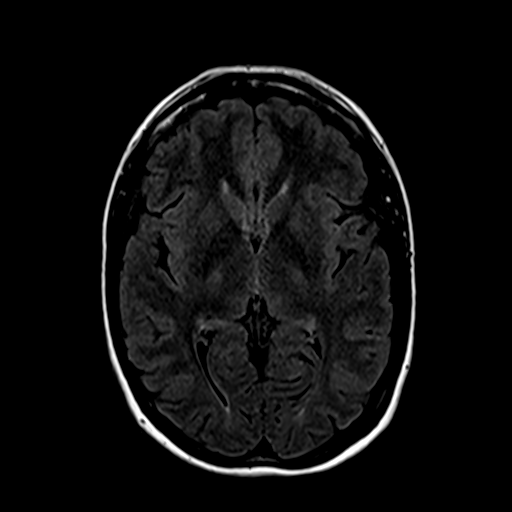
[im 30/30]
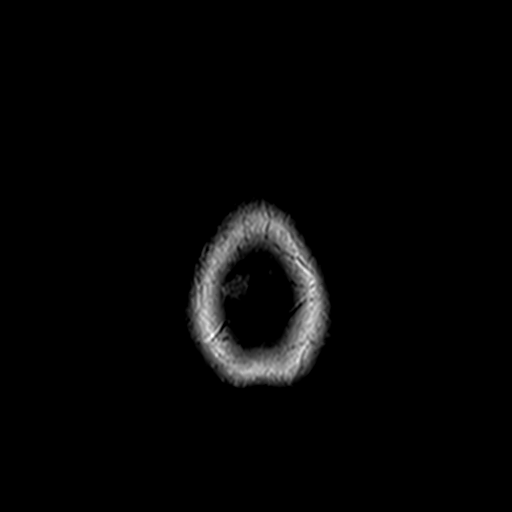

[Series 8: swi_images · axial · 4.0mm · 0.94mm/px · z∈[-46,+93]mm · 4 of 36 slices shown]
[im 1/36]
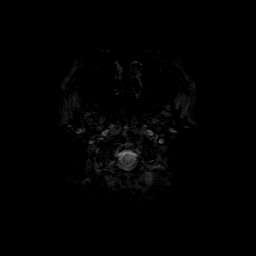
[im 12/36]
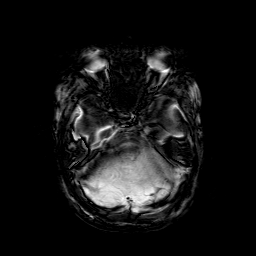
[im 24/36]
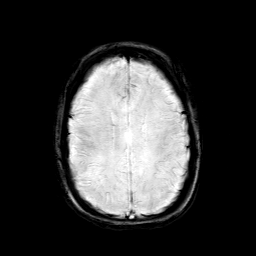
[im 36/36]
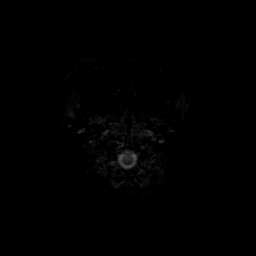

[Series 9: sag 3mm · sagittal · 3.0mm · 0.33mm/px · 1 of 11 slices shown]
[im 1/11]
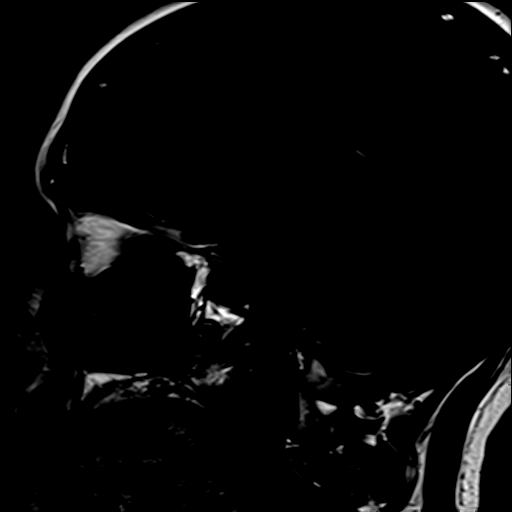

[Series 10: cor 3mm · coronal · 3.0mm · 0.33mm/px · 1 of 11 slices shown]
[im 1/11]
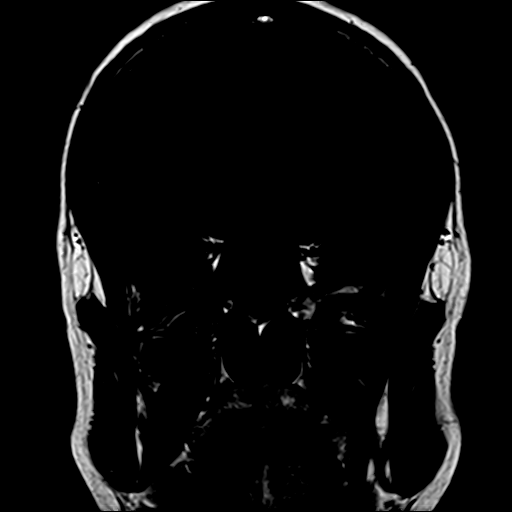

[Series 11: pre cor dynamic · coronal · non-contrast · 3.0mm · 0.35mm/px · 1 of 8 slices shown]
[im 1/8]
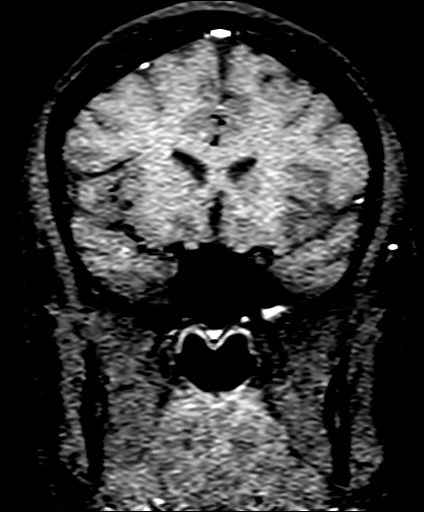

[Series 12: post fs cor · coronal · 3.0mm · 0.35mm/px · 1 of 8 slices shown (1 of 6)]
[im 1/8]
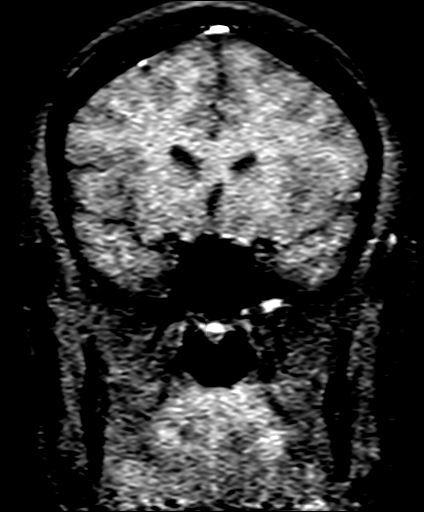

[Series 13: post fs cor · coronal · 3.0mm · 0.35mm/px · 1 of 8 slices shown (2 of 6)]
[im 1/8]
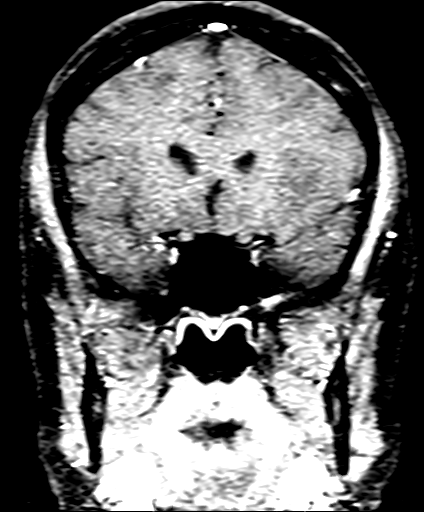

[Series 14: post fs cor · coronal · 3.0mm · 0.35mm/px · 1 of 8 slices shown (3 of 6)]
[im 1/8]
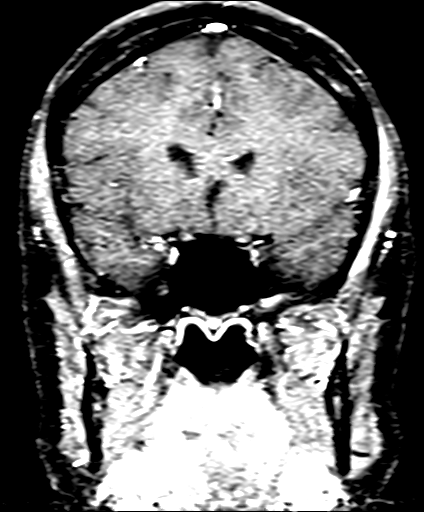

[Series 15: post fs cor · coronal · 3.0mm · 0.35mm/px · 1 of 8 slices shown (4 of 6)]
[im 1/8]
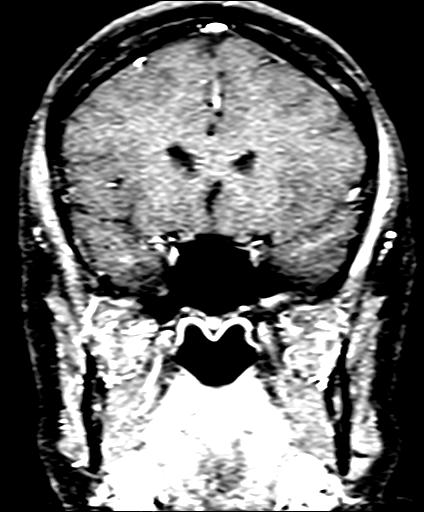

[Series 16: post fs cor · coronal · 3.0mm · 0.35mm/px · 1 of 8 slices shown (5 of 6)]
[im 1/8]
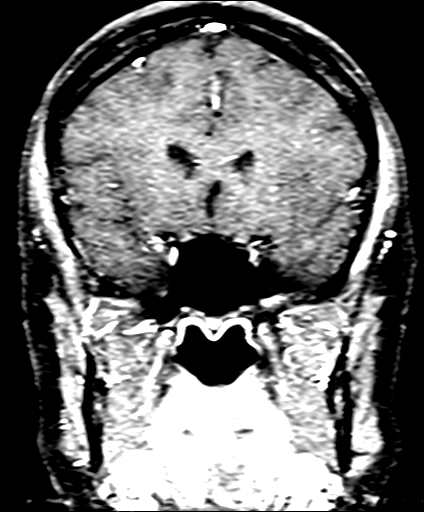

[Series 17: post fs cor · coronal · 3.0mm · 0.35mm/px · 1 of 8 slices shown (6 of 6)]
[im 1/8]
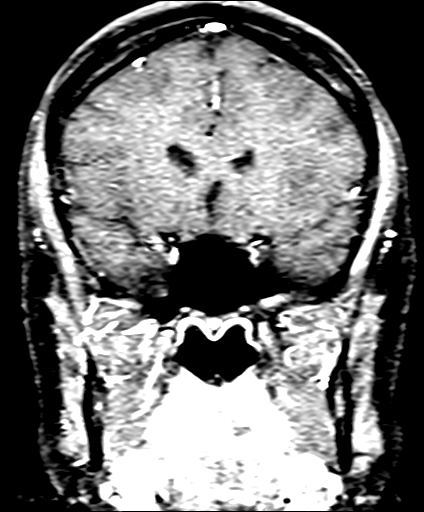

[Series 18: post sag 3mm · sagittal · 3.0mm · 0.33mm/px · 1 of 11 slices shown]
[im 1/11]
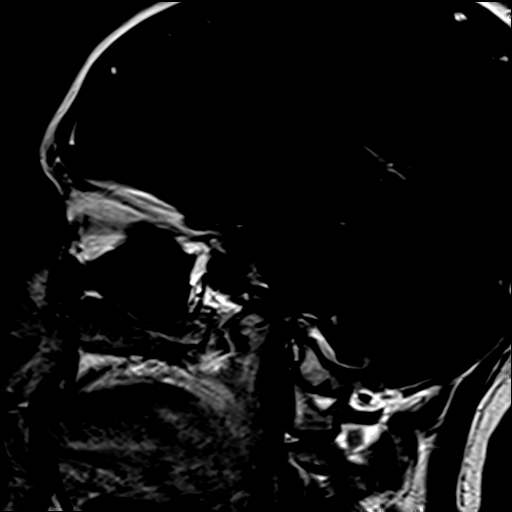

[Series 19: post cor 3mm · coronal · 3.0mm · 0.33mm/px · 1 of 11 slices shown]
[im 1/11]
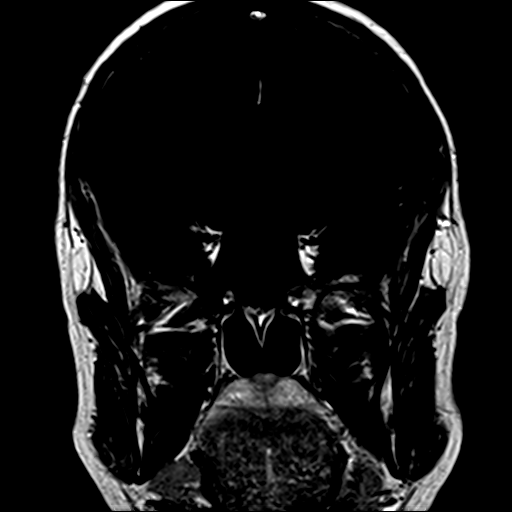

[37 of 48 positions shown; findings below may reference images not displayed]

FINDINGS: Brain: No acute infarction, hemorrhage, hydrocephalus, extra-axial
collection or mass lesion outside the pituitary.

Dedicated pituitary protocol was performed. There is a 7 x 8 x 6 mm
T2 hyperintense (series 5, image 7) and hypoenhancing lesion in the
posterior left pituitary gland. The lesion appears to have some
enhancement along its periphery, which doesn't change substantially
with dynamic contrast. The central portion lesion is largely
hypoenhancing on dynamic contrast. Resulting slight upward convexity
of the left pituitary. Infundibulum is within normal limits and
essentially midline.

Vascular: Major arterial flow voids are maintained at the skull
base.

Skull and upper cervical spine: Normal marrow signal.

Sinuses/Orbits: Visualized sinuses are clear.

Other: No mastoid effusions.
IMPRESSION: Approximately 8 mm hypoenhancing lesion in the posterior left
pituitary. Differential considerations include a pituitary
microadenoma (given provided clinical history and suggestion of some
enhancement along the periphery of the lesion) versus a pituitary
cyst (given T2 hyperintensity and little enhancement centrally with
dynamic contrast).

## 2020-12-24 MED ORDER — GADOBENATE DIMEGLUMINE 529 MG/ML IV SOLN
7.0000 mL | Freq: Once | INTRAVENOUS | Status: AC | PRN
  Administered 2020-12-24: 7 mL via INTRAVENOUS

## 2021-01-07 DIAGNOSIS — F411 Generalized anxiety disorder: Secondary | ICD-10-CM | POA: Diagnosis not present

## 2021-01-22 DIAGNOSIS — F411 Generalized anxiety disorder: Secondary | ICD-10-CM | POA: Diagnosis not present

## 2021-01-29 DIAGNOSIS — F900 Attention-deficit hyperactivity disorder, predominantly inattentive type: Secondary | ICD-10-CM | POA: Diagnosis not present

## 2021-01-29 DIAGNOSIS — Z79899 Other long term (current) drug therapy: Secondary | ICD-10-CM | POA: Diagnosis not present

## 2021-02-11 DIAGNOSIS — R41840 Attention and concentration deficit: Secondary | ICD-10-CM | POA: Diagnosis not present

## 2021-02-11 DIAGNOSIS — F411 Generalized anxiety disorder: Secondary | ICD-10-CM | POA: Diagnosis not present

## 2021-02-11 DIAGNOSIS — F32A Depression, unspecified: Secondary | ICD-10-CM | POA: Diagnosis not present

## 2021-02-11 DIAGNOSIS — F1010 Alcohol abuse, uncomplicated: Secondary | ICD-10-CM | POA: Diagnosis not present

## 2021-02-12 ENCOUNTER — Encounter: Payer: Self-pay | Admitting: Internal Medicine

## 2021-02-12 ENCOUNTER — Ambulatory Visit: Payer: BC Managed Care – PPO | Admitting: Internal Medicine

## 2021-02-12 ENCOUNTER — Other Ambulatory Visit: Payer: Self-pay

## 2021-02-12 VITALS — BP 116/78 | HR 58 | Ht 64.0 in | Wt 174.0 lb

## 2021-02-12 DIAGNOSIS — D352 Benign neoplasm of pituitary gland: Secondary | ICD-10-CM | POA: Diagnosis not present

## 2021-02-12 DIAGNOSIS — E221 Hyperprolactinemia: Secondary | ICD-10-CM | POA: Insufficient documentation

## 2021-02-12 NOTE — Progress Notes (Signed)
Name: Monica Jacobs  MRN/ DOB: [id number], 1991-07-10    Age/ Sex: 30 y.o., female    PCP: Inda Coke, PA   Reason for Endocrinology Evaluation: Pituitary microadenoma      Date of Initial Endocrinology Evaluation: 02/12/2021     HPI: Monica Jacobs is a 30 y.o. female with unremarkable  past medical history . The patient presented for initial endocrinology clinic visit on 02/12/2021 for consultative assistance with her pituitary microadenoma    During evaluation for oligomenorrhea  In 10/2020 she was noted to have elevated prolactin 86.1 ng/mL which prompted a brain MRI revealing a pituitary microadenoma of 8 mm .    She is S/P C-section 07/27/2019 , she pumped for ~ 6 months post delivery but continues to have mild nipple discharge up until 2 months ago    Prior to pregnancy she had regular periods but since delivery she noted irregular periods. Menarche at age 31  LMP 02/07/2021   Denies headaches or vision changes   Denies radiation exposure  Last eye exam was last year   Denies excessive sweating  Has noted weight gain  NO change in shoe or ring size  Denies BP issues    Planning on conceiving by next year, uses condoms  No FH of pituitary disease     HISTORY:  Past Medical History:  Past Medical History:  Diagnosis Date  . Anorexia   . Depression   . Environmental allergies    Past Surgical History:  Past Surgical History:  Procedure Laterality Date  . CESAREAN SECTION N/A 07/27/2019   Procedure: CESAREAN SECTION;  Surgeon: Linda Hedges, DO;  Location: [facility];  Service: Obstetrics;  Laterality: N/A;  Primary edc 08/03/19 NKDA Tracey RNFA  . NO PAST SURGERIES        Social History:  reports that she has never smoked. She has never used smokeless tobacco. She reports current alcohol use. She reports that she does not use drugs.  Family History: family history includes ADD / ADHD in her mother; Anxiety disorder in her mother; Cancer in her  father; Diabetes in her maternal grandfather; Heart attack in her maternal grandmother; Heart disease in her maternal grandfather; Hypercholesterolemia in her mother; Mental illness in her sister.   HOME MEDICATIONS: Allergies as of 02/12/2021   No Known Allergies     Medication List       Accurate as of February 12, 2021  3:06 PM. If you have any questions, ask your nurse or doctor.        STOP taking these medications   acetaminophen 325 MG tablet Commonly known as: TYLENOL Stopped by: Dorita Sciara, MD   ibuprofen 600 MG tablet Commonly known as: ADVIL Stopped by: Dorita Sciara, MD   multivitamin-prenatal 27-0.8 MG Tabs tablet Stopped by: Dorita Sciara, MD     TAKE these medications   methylphenidate 27 MG CR tablet Commonly known as: CONCERTA Take 27 mg by mouth daily.         REVIEW OF SYSTEMS: A comprehensive ROS was conducted with the patient and is negative except as per HPI     OBJECTIVE:  VS: BP 116/78   Pulse (!) 58   Ht 5\' 4"  (1.626 m)   Wt 174 lb (78.9 kg)   LMP 02/07/2021   SpO2 99%   BMI 29.87 kg/m    Wt Readings from Last 3 Encounters:  02/12/21 174 lb (78.9 kg)  01/29/20 168 lb 4 oz (  76.3 kg)  07/27/19 192 lb (87.1 kg)     EXAM: General: Pt appears well and is in NAD  Eyes: External eye exam normal without stare, lid lag or exophthalmos.  EOM intact.  PERRL.  Neck: General: Supple without adenopathy. Thyroid: Thyroid size normal.  No goiter or nodules appreciated.  Lungs: Clear with good BS bilat with no rales, rhonchi, or wheezes  Heart: Auscultation: RRR.  Abdomen: Normoactive bowel sounds, soft, nontender, without masses or organomegaly palpable  Extremities:  BL LE: No pretibial edema normal ROM and strength.  Skin: Hair: Texture and amount normal with gender appropriate distribution Skin Inspection: No rashes Skin Palpation: Skin temperature, texture, and thickness normal to palpation  Neuro: Cranial  nerves: II - XII grossly intact  DTRs: 2+ and symmetric in UE without delay in relaxation phase  Mental Status: Judgment, insight: Intact Orientation: Oriented to time, place, and person Mood and affect: No depression, anxiety, or agitation     DATA REVIEWED:    11/28/2020 Prolactin 86.1 ng/mL   11/08/2020 Prolactin 51.3 DHEAS 85.6 ug/dL ( 84.8-378) TSH 0.962 uIU/mL     Brain MRI 12/24/2020   There is a 7 x 8 x 6 mm T2 hyperintense (series 5, image 7) and hypoenhancing lesion in the posterior left pituitary gland. The lesion appears to have some enhancement along its periphery, which doesn't change substantially with dynamic contrast. The central portion lesion is largely hypoenhancing on dynamic contrast. Resulting slight upward convexity of the left pituitary. Infundibulum is within normal limits and essentially midline.    Approximately 8 mm hypoenhancing lesion in the posterior left pituitary. Differential considerations include a pituitary microadenoma (given provided clinical history and suggestion of some enhancement along the periphery of the lesion) versus a pituitary cyst (given T2 hyperintensity and little enhancement centrally with dynamic contrast).   ASSESSMENT/PLAN/RECOMMENDATIONS:   1. Pituitary Microadenoma:  - No local symptoms  - Will repeat MRI in 12/2021 - Will check pituitary hormones, she will come back fasting and around 8 Am - Pt advised to avoid conception at this time, she is not a fan of hormonal birth control and would like to continue to use condoms. We discussed the reason to avoid pregnancy is to avoid further increase in size of pituitary gland as this could cause optic nerve compression . - She was advised to have an up dated visual field test     2. Prolactinoma :   - Once repeat labs are back, will consider starting her on cabergoline, cautioned against GI side effects   Medication Cabergoline 0.5 mg- pending prolactin level      Signed electronically by: Mack Guise, MD  [facility] Endocrinology  [address] [place], [place] [address] Phone: [phone] FAX: [phone]   CC: Inda Coke, [address] [place] [address] Phone: [phone] Fax: [phone]   Return to Endocrinology clinic as below: No future appointments.

## 2021-02-12 NOTE — Patient Instructions (Signed)
-   Please make sure you schedule an a visual field test through your eye doctor

## 2021-02-13 DIAGNOSIS — F411 Generalized anxiety disorder: Secondary | ICD-10-CM | POA: Diagnosis not present

## 2021-02-14 ENCOUNTER — Other Ambulatory Visit: Payer: Self-pay

## 2021-02-14 ENCOUNTER — Other Ambulatory Visit (INDEPENDENT_AMBULATORY_CARE_PROVIDER_SITE_OTHER): Payer: BC Managed Care – PPO

## 2021-02-14 DIAGNOSIS — D352 Benign neoplasm of pituitary gland: Secondary | ICD-10-CM

## 2021-02-14 LAB — COMPREHENSIVE METABOLIC PANEL
ALT: 15 U/L (ref 0–35)
AST: 15 U/L (ref 0–37)
Albumin: 4.2 g/dL (ref 3.5–5.2)
Alkaline Phosphatase: 54 U/L (ref 39–117)
BUN: 13 mg/dL (ref 6–23)
CO2: 28 mEq/L (ref 19–32)
Calcium: 9.3 mg/dL (ref 8.4–10.5)
Chloride: 105 mEq/L (ref 96–112)
Creatinine, Ser: 0.88 mg/dL (ref 0.40–1.20)
GFR: 87.19 mL/min (ref >60.00)
Glucose, Bld: 78 mg/dL (ref 70–99)
Potassium: 4.1 mEq/L (ref 3.5–5.1)
Sodium: 140 mEq/L (ref 135–145)
Total Bilirubin: 0.5 mg/dL (ref 0.2–1.2)
Total Protein: 7.3 g/dL (ref 6.0–8.3)

## 2021-02-14 LAB — LUTEINIZING HORMONE: LH: 4.78 m[IU]/mL

## 2021-02-14 LAB — FOLLICLE STIMULATING HORMONE: FSH: 6.2 m[IU]/mL

## 2021-02-14 LAB — T4, FREE: Free T4: 0.62 ng/dL (ref 0.60–1.60)

## 2021-02-14 LAB — CORTISOL: Cortisol, Plasma: 12.4 ug/dL

## 2021-02-14 LAB — TSH: TSH: 1.89 u[IU]/mL (ref 0.35–4.50)

## 2021-02-17 ENCOUNTER — Other Ambulatory Visit: Payer: Self-pay | Admitting: Internal Medicine

## 2021-02-17 ENCOUNTER — Encounter: Payer: Self-pay | Admitting: Internal Medicine

## 2021-02-17 MED ORDER — CABERGOLINE 0.5 MG PO TABS: 0.2500 mg | ORAL_TABLET | ORAL | 1 refills | Status: DC

## 2021-02-17 NOTE — Progress Notes (Signed)
Cabergoline started   Half a tablet twice a week

## 2021-02-19 DIAGNOSIS — Z713 Dietary counseling and surveillance: Secondary | ICD-10-CM | POA: Diagnosis not present

## 2021-02-19 LAB — INSULIN-LIKE GROWTH FACTOR
IGF-I, LC/MS: 266 ng/mL (ref 53–331)
Z-Score (Female): 1.3 SD (ref ?–2.0)

## 2021-02-19 LAB — HCG, SERUM, QUALITATIVE: Preg, Serum: NEGATIVE

## 2021-02-19 LAB — ESTRADIOL: Estradiol: 22 pg/mL

## 2021-02-19 LAB — PROLACTIN: Prolactin: 33.3 ng/mL — ABNORMAL HIGH

## 2021-02-19 LAB — ACTH: C206 ACTH: 42 pg/mL (ref 6–50)

## 2021-02-28 DIAGNOSIS — F411 Generalized anxiety disorder: Secondary | ICD-10-CM | POA: Diagnosis not present

## 2021-03-06 DIAGNOSIS — H05113 Granuloma of bilateral orbits: Secondary | ICD-10-CM | POA: Diagnosis not present

## 2021-03-19 DIAGNOSIS — F411 Generalized anxiety disorder: Secondary | ICD-10-CM | POA: Diagnosis not present

## 2021-03-28 DIAGNOSIS — J069 Acute upper respiratory infection, unspecified: Secondary | ICD-10-CM | POA: Diagnosis not present

## 2021-03-29 DIAGNOSIS — J069 Acute upper respiratory infection, unspecified: Secondary | ICD-10-CM | POA: Diagnosis not present

## 2021-03-29 DIAGNOSIS — H10023 Other mucopurulent conjunctivitis, bilateral: Secondary | ICD-10-CM | POA: Diagnosis not present

## 2021-04-03 DIAGNOSIS — F411 Generalized anxiety disorder: Secondary | ICD-10-CM | POA: Diagnosis not present

## 2021-04-03 DIAGNOSIS — F32A Depression, unspecified: Secondary | ICD-10-CM | POA: Diagnosis not present

## 2021-04-21 DIAGNOSIS — F32A Depression, unspecified: Secondary | ICD-10-CM | POA: Diagnosis not present

## 2021-04-21 DIAGNOSIS — F411 Generalized anxiety disorder: Secondary | ICD-10-CM | POA: Diagnosis not present

## 2021-04-23 DIAGNOSIS — F411 Generalized anxiety disorder: Secondary | ICD-10-CM | POA: Diagnosis not present

## 2021-05-05 DIAGNOSIS — F411 Generalized anxiety disorder: Secondary | ICD-10-CM | POA: Diagnosis not present

## 2021-05-09 DIAGNOSIS — F411 Generalized anxiety disorder: Secondary | ICD-10-CM | POA: Diagnosis not present

## 2021-05-09 DIAGNOSIS — F32A Depression, unspecified: Secondary | ICD-10-CM | POA: Diagnosis not present

## 2021-05-14 ENCOUNTER — Encounter: Payer: Self-pay | Admitting: Internal Medicine

## 2021-05-14 ENCOUNTER — Ambulatory Visit: Payer: BC Managed Care – PPO | Admitting: Internal Medicine

## 2021-05-14 ENCOUNTER — Other Ambulatory Visit: Payer: Self-pay

## 2021-05-14 VITALS — BP 118/64 | HR 88 | Ht 63.0 in | Wt 170.0 lb

## 2021-05-14 DIAGNOSIS — E221 Hyperprolactinemia: Secondary | ICD-10-CM

## 2021-05-14 DIAGNOSIS — D352 Benign neoplasm of pituitary gland: Secondary | ICD-10-CM

## 2021-05-14 MED ORDER — CABERGOLINE 0.5 MG PO TABS: 0.2500 mg | ORAL_TABLET | ORAL | 1 refills | Status: DC

## 2021-05-14 NOTE — Progress Notes (Signed)
Name: Monica Jacobs  MRN/ DOB: [id number], 1991/08/01    Age/ Sex: 30 y.o., female     PCP: Inda Coke, PA   Reason for Endocrinology Evaluation: Pituitary microadenoma      Initial Endocrinology Clinic Visit: 02/12/2021    PATIENT IDENTIFIER: Monica Jacobs is a 30 y.o., female with a past medical history of .Pituitary microadenoma  She has followed with [facility] since 02/12/21 for consultative assistance with management of her Pituitary microadenoma .   HISTORICAL SUMMARY:   During evaluation for oligomenorrhea  In 10/2020 she was noted to have elevated prolactin 86.1 ng/mL which prompted a brain MRI revealing a pituitary microadenoma of 8 mm .     She is S/P C-section 07/27/2019 , she pumped for ~ 6 months post delivery , but continues to have nipple discharge until 11/2020  Her prolactin level was slightly elevated at our clinic at 33.3 NG/mL, she was started on cabergoline in 01/2021   Prior to pregnancy she had regular periods but since delivery she noted irregular periods. Menarche at age 28   No FH of pituitary disease   SUBJECTIVE:   Today (05/14/2021):  Monica Jacobs is here for a follow-up on pituitary microadenoma  Denies constipation and diarrhea  Denies dizziness  Denies nipple discharge  LMP 04/11/2021 - regular  Denies headaches or vision changes   Last eye exam 02/2021 normal Visual field   Planning on conceiving by next year, uses condoms     Cabergoline half a tablet twice weekly( Monday and Thursday )     HISTORY:  Past Medical History:  Past Medical History:  Diagnosis Date   Anorexia    Depression    Environmental allergies    Past Surgical History:  Past Surgical History:  Procedure Laterality Date   CESAREAN SECTION N/A 07/27/2019   Procedure: CESAREAN SECTION;  Surgeon: Linda Hedges, DO;  Location: [facility];  Service: Obstetrics;  Laterality: N/A;  Primary edc 08/03/19 NKDA Tracey RNFA   NO PAST SURGERIES      Social History:  reports that she has never smoked. She has never used smokeless tobacco. She reports current alcohol use. She reports that she does not use drugs. Family History:  Family History  Problem Relation Age of Onset   Anxiety disorder Mother    Hypercholesterolemia Mother    ADD / ADHD Mother    Cancer Father        skin   Heart attack Maternal Grandmother    Mental illness Sister    Diabetes Maternal Grandfather    Heart disease Maternal Grandfather    Colon cancer Neg Hx    Breast cancer Neg Hx    Prostate cancer Neg Hx      HOME MEDICATIONS: Allergies as of 05/14/2021   No Known Allergies      Medication List        Accurate as of May 14, 2021  1:41 PM. If you have any questions, ask your nurse or doctor.          cabergoline 0.5 MG tablet Commonly known as: DOSTINEX Take 0.5 tablets (0.25 mg total) by mouth 2 (two) times a week.   methylphenidate 27 MG CR tablet Commonly known as: CONCERTA Take 27 mg by mouth daily.          OBJECTIVE:   PHYSICAL EXAM: VS: BP 118/64   Pulse 88   Ht 5\' 3"  (1.6 m)   Wt 170 lb (77.1 kg)  SpO2 97%   BMI 30.11 kg/m    EXAM: General: Pt appears well and is in NAD  Neck: General: Supple without adenopathy. Thyroid: Thyroid size normal.  No goiter or nodules appreciated.   Lungs: Clear with good BS bilat with no rales, rhonchi, or wheezes  Heart: Auscultation: RRR.  Abdomen: Normoactive bowel sounds, soft, nontender, without masses or organomegaly palpable  Extremities:  BL LE: No pretibial edema normal ROM and strength.  Mental Status: Judgment, insight: Intact Mood and affect: No depression, anxiety, or agitation     DATA REVIEWED: Results for AAYRA, HORNBAKER (MRN [id number]) as of 05/14/2021 13:41  Ref. Range 02/14/2021 08:11  Cortisol, Plasma Latest Units: ug/dL 12.4  LH Latest Units: mIU/mL 4.78  FSH Latest Units: mIU/ML 6.2  Prolactin Latest Units: ng/mL 33.3 (H)  Glucose Latest Ref  Range: 70 - 99 mg/dL 78  Estradiol Latest Units: pg/mL 22  Preg, Serum Unknown NEGATIVE  TSH Latest Ref Range: 0.35 - 4.50 uIU/mL 1.89  T4,Free(Direct) Latest Ref Range: 0.60 - 1.60 ng/dL 0.62    Brain MRI 12/24/2020     There is a 7 x 8 x 6 mm T2 hyperintense (series 5, image 7) and hypoenhancing lesion in the posterior left pituitary gland. The lesion appears to have some enhancement along its periphery, which doesn't change substantially with dynamic contrast. The central portion lesion is largely hypoenhancing on dynamic contrast. Resulting slight upward convexity of the left pituitary. Infundibulum is within normal limits and essentially midline.      Approximately 8 mm hypoenhancing lesion in the posterior left pituitary. Differential considerations include a pituitary microadenoma (given provided clinical history and suggestion of some enhancement along the periphery of the lesion) versus a pituitary cyst (given T2 hyperintensity and little enhancement centrally with dynamic contrast).      ASSESSMENT / PLAN / RECOMMENDATIONS:   Pituitary Microadenoma:   - No local symptoms - Will repeat MRI in 12/2021 - Pituitary hormones, have all come back normal except prolactin  -  Update visual field normal 02/2021       2. Prolactinoma :     - Tolerating cabergoline without side effects  - Will check labs next week as we do not have a phlebotomist available today  - Pt advised to avoid conception at this time, and to stop cabergline should she test positive for pregnancy   Medication Cabergoline 0.5 mg , half a tablet twice weekly         Signed electronically by: Mack Guise, MD  [facility] Endocrinology  [address] [place], [place] [address] Phone: [phone] FAX: [phone]      CC: Inda Coke, [place] [address] Phone: [phone]  Fax: [phone]   Return to  Endocrinology clinic as below: No future appointments.

## 2021-05-16 DIAGNOSIS — F411 Generalized anxiety disorder: Secondary | ICD-10-CM | POA: Diagnosis not present

## 2021-05-19 DIAGNOSIS — F411 Generalized anxiety disorder: Secondary | ICD-10-CM | POA: Diagnosis not present

## 2021-05-19 DIAGNOSIS — F32A Depression, unspecified: Secondary | ICD-10-CM | POA: Diagnosis not present

## 2021-05-20 ENCOUNTER — Other Ambulatory Visit: Payer: Self-pay

## 2021-05-20 ENCOUNTER — Other Ambulatory Visit (INDEPENDENT_AMBULATORY_CARE_PROVIDER_SITE_OTHER): Payer: BC Managed Care – PPO

## 2021-05-20 DIAGNOSIS — E221 Hyperprolactinemia: Secondary | ICD-10-CM

## 2021-05-20 LAB — COMPREHENSIVE METABOLIC PANEL
ALT: 15 U/L (ref 0–35)
AST: 16 U/L (ref 0–37)
Albumin: 4.3 g/dL (ref 3.5–5.2)
Alkaline Phosphatase: 52 U/L (ref 39–117)
BUN: 8 mg/dL (ref 6–23)
CO2: 27 mEq/L (ref 19–32)
Calcium: 9.3 mg/dL (ref 8.4–10.5)
Chloride: 105 mEq/L (ref 96–112)
Creatinine, Ser: 0.86 mg/dL (ref 0.40–1.20)
GFR: 89.46 mL/min (ref >60.00)
Glucose, Bld: 87 mg/dL (ref 70–99)
Potassium: 4.3 mEq/L (ref 3.5–5.1)
Sodium: 140 mEq/L (ref 135–145)
Total Bilirubin: 0.4 mg/dL (ref 0.2–1.2)
Total Protein: 7.1 g/dL (ref 6.0–8.3)

## 2021-05-21 LAB — PROLACTIN: Prolactin: 10.8 ng/mL

## 2021-05-23 DIAGNOSIS — F411 Generalized anxiety disorder: Secondary | ICD-10-CM | POA: Diagnosis not present

## 2021-05-30 DIAGNOSIS — F411 Generalized anxiety disorder: Secondary | ICD-10-CM | POA: Diagnosis not present

## 2021-06-09 DIAGNOSIS — F32A Depression, unspecified: Secondary | ICD-10-CM | POA: Diagnosis not present

## 2021-06-09 DIAGNOSIS — F411 Generalized anxiety disorder: Secondary | ICD-10-CM | POA: Diagnosis not present

## 2021-06-13 DIAGNOSIS — F900 Attention-deficit hyperactivity disorder, predominantly inattentive type: Secondary | ICD-10-CM | POA: Diagnosis not present

## 2021-06-13 DIAGNOSIS — F902 Attention-deficit hyperactivity disorder, combined type: Secondary | ICD-10-CM | POA: Diagnosis not present

## 2021-06-13 DIAGNOSIS — Z79899 Other long term (current) drug therapy: Secondary | ICD-10-CM | POA: Diagnosis not present

## 2021-06-20 DIAGNOSIS — F32A Depression, unspecified: Secondary | ICD-10-CM | POA: Diagnosis not present

## 2021-06-20 DIAGNOSIS — F411 Generalized anxiety disorder: Secondary | ICD-10-CM | POA: Diagnosis not present

## 2021-07-01 DIAGNOSIS — Z713 Dietary counseling and surveillance: Secondary | ICD-10-CM | POA: Diagnosis not present

## 2021-07-04 DIAGNOSIS — F32A Depression, unspecified: Secondary | ICD-10-CM | POA: Diagnosis not present

## 2021-07-04 DIAGNOSIS — F411 Generalized anxiety disorder: Secondary | ICD-10-CM | POA: Diagnosis not present

## 2021-07-10 ENCOUNTER — Other Ambulatory Visit: Payer: Self-pay

## 2021-07-10 ENCOUNTER — Ambulatory Visit (INDEPENDENT_AMBULATORY_CARE_PROVIDER_SITE_OTHER): Payer: BC Managed Care – PPO | Admitting: Physician Assistant

## 2021-07-10 ENCOUNTER — Encounter: Payer: Self-pay | Admitting: Physician Assistant

## 2021-07-10 VITALS — BP 102/70 | HR 83 | Temp 98.0°F | Ht 64.0 in | Wt 171.2 lb

## 2021-07-10 DIAGNOSIS — Z0000 Encounter for general adult medical examination without abnormal findings: Secondary | ICD-10-CM | POA: Diagnosis not present

## 2021-07-10 DIAGNOSIS — E663 Overweight: Secondary | ICD-10-CM | POA: Diagnosis not present

## 2021-07-10 DIAGNOSIS — R41840 Attention and concentration deficit: Secondary | ICD-10-CM | POA: Diagnosis not present

## 2021-07-10 LAB — CBC WITH DIFFERENTIAL/PLATELET
Basophils Absolute: 0 10*3/uL (ref 0.0–0.1)
Basophils Relative: 0.4 % (ref 0.0–3.0)
Eosinophils Absolute: 0.2 10*3/uL (ref 0.0–0.7)
Eosinophils Relative: 2.2 % (ref 0.0–5.0)
HCT: 40.1 % (ref 36.0–46.0)
Hemoglobin: 12.8 g/dL (ref 12.0–15.0)
Lymphocytes Relative: 27 % (ref 12.0–46.0)
Lymphs Abs: 2 10*3/uL (ref 0.7–4.0)
MCHC: 32 g/dL (ref 30.0–36.0)
MCV: 89.1 fl (ref 78.0–100.0)
Monocytes Absolute: 0.6 10*3/uL (ref 0.1–1.0)
Monocytes Relative: 7.5 % (ref 3.0–12.0)
Neutro Abs: 4.7 10*3/uL (ref 1.4–7.7)
Neutrophils Relative %: 62.9 % (ref 43.0–77.0)
Platelets: 198 10*3/uL (ref 150.0–400.0)
RBC: 4.5 Mil/uL (ref 3.87–5.11)
RDW: 14.1 % (ref 11.5–15.5)
WBC: 7.5 10*3/uL (ref 4.0–10.5)

## 2021-07-10 LAB — COMPREHENSIVE METABOLIC PANEL
ALT: 15 U/L (ref 0–35)
AST: 16 U/L (ref 0–37)
Albumin: 4 g/dL (ref 3.5–5.2)
Alkaline Phosphatase: 51 U/L (ref 39–117)
BUN: 12 mg/dL (ref 6–23)
CO2: 27 mEq/L (ref 19–32)
Calcium: 9.1 mg/dL (ref 8.4–10.5)
Chloride: 103 mEq/L (ref 96–112)
Creatinine, Ser: 0.82 mg/dL (ref 0.40–1.20)
GFR: 94.63 mL/min (ref >60.00)
Glucose, Bld: 72 mg/dL (ref 70–99)
Potassium: 3.8 mEq/L (ref 3.5–5.1)
Sodium: 138 mEq/L (ref 135–145)
Total Bilirubin: 0.3 mg/dL (ref 0.2–1.2)
Total Protein: 6.9 g/dL (ref 6.0–8.3)

## 2021-07-10 LAB — LIPID PANEL
Cholesterol: 147 mg/dL (ref 0–200)
HDL: 52.5 mg/dL (ref >39.00)
LDL Cholesterol: 74 mg/dL (ref 0–99)
NonHDL: 94.92
Total CHOL/HDL Ratio: 3
Triglycerides: 107 mg/dL (ref 0.0–149.0)
VLDL: 21.4 mg/dL (ref 0.0–40.0)

## 2021-07-10 NOTE — Progress Notes (Signed)
Subjective:    Monica Jacobs is a 30 y.o. female and is here for a comprehensive physical exam.  HPI  There are no preventive care reminders to display for this patient.  Social: She no longer works for the [employer] . Now works for [employer].  Acute Concerns:  None addressed today.  Chronic Issues:  Weight: She has been trying to lose weight since having her baby. Has tried portion meals, cutting sodas - stopped sugar intake 2 years ago. Walking has not helped - walks at least 120 minutes daily. She has also tried tracking her caloric intake in the past but this was ineffective. She had been doing pure barre for a week - did not find this helpful either. She wants to get back to around 160 - weight before the baby. Today her weight is 171 lbs. She was going to try elimination diets to check for any sensitivity for certain foods- mother and sister has issues with gluten and dairy - denies any diarrhea or rash. 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 Drinks 1 glass of wine with dinners on the weekends. She eats a reasonably healthy breakfast, lunch, and dinner - she does snack sometimes but even this is with healthier food choices (apples, vegetables. She does see increased muscle tone.  She does not want to eventually have another pregnancy and have issues with her weight - no gestational diabetes while pregnant. - if pregnant she will change her exercise routine - will plan for this possibly after next year. Has not expressed this with endocrinology - had issues with prolactin in the past.  ADHD: Patient is currently taking methylphenidate 27 mg daily and PO - less compulsive with foods when she takes this medication but no weight loss reported.  Health Maintenance: Immunizations -- COVID-19- she will plan to get the Omicron specific booster when possible and Flu vaccination - last received 07/29/19 - has scheduled next week to get this  at work PAP -- Last done 01/25/16 with a 3-year recall Bone Density -- N/A Colonoscopy -- N/A Mammogram -- N/A Ophthalmology -- goes next week to plan for an eye surgery. Dentistry -- goes regularly. Diet --  avoids soda, eats all food groups - in the past planned to eat a plant based diet Caffeine intake -- 1 coffee daily - gets really shaky if she drinks more than one Sleep habits -- not great - even before having her child she had issues sleeping - she is a light sleeper now having baby - methylphenidate makes it hard for her to get sleep as well Exercise -- goes on regular daily walks - at least 120 minutes daily Weight History: Wt Readings from Last 10 Encounters:  07/10/21 171 lb 4 oz (77.7 kg)  05/14/21 170 lb (77.1 kg)  02/12/21 174 lb (78.9 kg)  01/29/20 168 lb 4 oz (76.3 kg)  07/27/19 192 lb (87.1 kg)  07/20/19 192 lb (87.1 kg)  03/02/17 162 lb 14.4 oz (73.9 kg)   Body mass index is 29.39 kg/m. Mood -- feels steady - overall happy - she does see a therapist regularly  Patient's last menstrual period was 07/09/2021 (exact date). Period characteristics -- believes they ar back to regular cycles Birth control -- none    reports current alcohol use.  Tobacco Use: Low Risk    Smoking Tobacco Use: Never   Smokeless Tobacco Use: Never     Depression screen PHQ  2/9 07/10/2021  Decreased Interest 0  Down, Depressed, Hopeless 0  PHQ - 2 Score 0     Other providers/specialists: Patient Care Team: Inda Coke, [place] as PCP - General (Physician Assistant)   PMHx, SurgHx, SocialHx, Medications, and Allergies were reviewed in the Visit Navigator and updated as appropriate.   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   Depression    Environmental allergies      Past Surgical History:  Procedure Laterality Date   CESAREAN SECTION N/A 07/27/2019   Procedure: CESAREAN SECTION;  Surgeon: Linda Hedges, DO;  Location: [facility] LD  ORS;  Service: Obstetrics;  Laterality: N/A;  Primary edc 08/03/19 NKDA Tracey RNFA   NO PAST SURGERIES       Family History  Problem Relation Age of Onset   Anxiety disorder Mother    Hypercholesterolemia Mother    ADD / ADHD Mother    Cancer Father        skin   Heart attack Maternal Grandmother    Mental illness Sister    Diabetes Maternal Grandfather    Heart disease Maternal Grandfather    Colon cancer Neg Hx    Breast cancer Neg Hx    Prostate cancer Neg Hx     Social History   Tobacco Use   Smoking status: Never   Smokeless tobacco: Never  Vaping Use   Vaping Use: Never used  Substance Use Topics   Alcohol use: Yes    Comment: Occasionally   Drug use: No    Review of Systems:   Review of Systems  Constitutional:  Negative for chills, fever, malaise/fatigue and weight loss.  HENT:  Negative for hearing loss, sinus pain and sore throat.   Respiratory:  Negative for cough and hemoptysis.   Cardiovascular:  Negative for chest pain, palpitations, leg swelling and PND.  Gastrointestinal:  Negative for abdominal pain, constipation, diarrhea, heartburn, nausea and vomiting.  Genitourinary:  Negative for dysuria, frequency and urgency.  Musculoskeletal:  Negative for back pain, myalgias and neck pain.  Skin:  Negative for itching and rash.  Neurological:  Negative for dizziness, tingling, seizures and headaches.  Endo/Heme/Allergies:  Negative for polydipsia.  Psychiatric/Behavioral:  Negative for depression. The patient is not nervous/anxious.    Objective:   BP 102/70 (BP Location: Left Arm, Patient Position: Sitting, Cuff Size: Normal)   Pulse 83   Temp 98 F (36.7 C) (Temporal)   Ht '5\' 4"'$  (1.626 m)   Wt 171 lb 4 oz (77.7 kg)   LMP 07/09/2021 (Exact Date)   SpO2 100%   Breastfeeding No   BMI 29.39 kg/m   General Appearance:    Alert, cooperative, no distress, appears stated age  Head:    Normocephalic, without obvious abnormality, atraumatic  Eyes:     PERRL, conjunctiva/corneas clear, EOM's intact, fundi    benign, both eyes  Ears:    Normal TM's and external ear canals, both ears  Nose:   Nares normal, septum midline, mucosa normal, no drainage    or sinus tenderness  Throat:   Lips, mucosa, and tongue normal; teeth and gums normal  Neck:   Supple, symmetrical, trachea midline, no adenopathy;    thyroid:  no enlargement/tenderness/nodules; no carotid   bruit or JVD  Back:     Symmetric, no curvature, ROM normal, no CVA tenderness  Lungs:     Clear to auscultation bilaterally, respirations unlabored  Chest Wall:  No tenderness or deformity   Heart:    Regular rate and rhythm, S1 and S2 normal, no murmur, rub   or gallop  Breast Exam:    Deferred  Abdomen:     Soft, non-tender, bowel sounds active all four quadrants,    no masses, no organomegaly  Genitalia:    Deferred  Rectal:    Deferred  Extremities:   Extremities normal, atraumatic, no cyanosis or edema  Pulses:   2+ and symmetric all extremities  Skin:   Skin color, texture, turgor normal, no rashes or lesions  Lymph nodes:   Cervical, supraclavicular, and axillary nodes normal  Neurologic:   CNII-XII intact, normal strength, sensation and reflexes    throughout    Assessment/Plan:  1. Routine physical examination Today patient counseled on age appropriate routine health concerns for screening and prevention, each reviewed and up to date or declined. Immunizations reviewed and up to date or declined. Labs ordered and reviewed. Risk factors for depression reviewed and negative. Hearing function and visual acuity are intact. ADLs screened and addressed as needed. Functional ability and level of safety reviewed and appropriate. Education, counseling and referrals performed based on assessed risks today. Patient provided with a copy of personalized plan for preventive services.   2. Attention or concentration deficit Well controlled with methylphenidate Mgmt per  prescriber  3. Overweight Encouraged ongoing efforts Declined RD referral, medications today     Patient Counseling:   '[x]'$     Nutrition: Stressed importance of moderation in sodium/caffeine intake, saturated fat and cholesterol, caloric balance, sufficient intake of fresh fruits, vegetables, fiber, calcium, iron, and 1 mg of folate supplement per day (for females capable of pregnancy).   '[x]'$      Stressed the importance of regular exercise.    '[x]'$     Substance Abuse: Discussed cessation/primary prevention of tobacco, alcohol, or other drug use; driving or other dangerous activities under the influence; availability of treatment for abuse.    '[x]'$      Injury prevention: Discussed safety belts, safety helmets, smoke detector, smoking near bedding or upholstery.    '[x]'$      Sexuality: Discussed sexually transmitted diseases, partner selection, use of condoms, avoidance of unintended pregnancy  and contraceptive alternatives.    '[x]'$     Dental health: Discussed importance of regular tooth brushing, flossing, and dental visits.   '[x]'$      Health maintenance and immunizations reviewed. Please refer to Health maintenance section.    I,Harris Phan,acting as a Education administrator for [employer], PA.,have documented all relevant documentation on the behalf of Inda Coke, PA,as directed by  Inda Coke, PA while in the presence of Inda Coke, [place].  I, Inda Coke, [place], have reviewed all documentation for this visit. The documentation on 07/10/21 for the exam, diagnosis, procedures, and orders are all accurate and complete.   Inda Coke, PA-C [place]

## 2021-07-10 NOTE — Patient Instructions (Addendum)
It was great to see you!  Continue to work on healthy eating as you are able You are doing great, keep up the good work  Please get your covid and flu vaccines  Please go to the lab for blood work.   Our office will call you with your results unless you have chosen to receive results via MyChart.  If your blood work is normal we will follow-up each year for physicals and as scheduled for chronic medical problems.  If anything is abnormal we will treat accordingly and get you in for a follow-up.  Take care,  Aldona Bar

## 2021-07-15 DIAGNOSIS — F32A Depression, unspecified: Secondary | ICD-10-CM | POA: Diagnosis not present

## 2021-07-15 DIAGNOSIS — F411 Generalized anxiety disorder: Secondary | ICD-10-CM | POA: Diagnosis not present

## 2021-07-30 DIAGNOSIS — F32A Depression, unspecified: Secondary | ICD-10-CM | POA: Diagnosis not present

## 2021-07-30 DIAGNOSIS — F411 Generalized anxiety disorder: Secondary | ICD-10-CM | POA: Diagnosis not present

## 2021-08-22 DIAGNOSIS — F32A Depression, unspecified: Secondary | ICD-10-CM | POA: Diagnosis not present

## 2021-08-22 DIAGNOSIS — F411 Generalized anxiety disorder: Secondary | ICD-10-CM | POA: Diagnosis not present

## 2021-09-10 ENCOUNTER — Other Ambulatory Visit: Payer: Self-pay

## 2021-09-10 ENCOUNTER — Encounter: Payer: Self-pay | Admitting: Internal Medicine

## 2021-09-10 ENCOUNTER — Ambulatory Visit: Payer: BC Managed Care – PPO | Admitting: Internal Medicine

## 2021-09-10 VITALS — BP 122/80 | HR 83 | Ht 64.0 in | Wt 176.0 lb

## 2021-09-10 DIAGNOSIS — R7989 Other specified abnormal findings of blood chemistry: Secondary | ICD-10-CM | POA: Diagnosis not present

## 2021-09-10 DIAGNOSIS — D352 Benign neoplasm of pituitary gland: Secondary | ICD-10-CM

## 2021-09-10 DIAGNOSIS — E221 Hyperprolactinemia: Secondary | ICD-10-CM | POA: Diagnosis not present

## 2021-09-10 LAB — BASIC METABOLIC PANEL
BUN: 12 mg/dL (ref 6–23)
CO2: 25 mEq/L (ref 19–32)
Calcium: 9.1 mg/dL (ref 8.4–10.5)
Chloride: 105 mEq/L (ref 96–112)
Creatinine, Ser: 0.85 mg/dL (ref 0.40–1.20)
GFR: 90.53 mL/min (ref >60.00)
Glucose, Bld: 104 mg/dL — ABNORMAL HIGH (ref 70–99)
Potassium: 3.8 mEq/L (ref 3.5–5.1)
Sodium: 140 mEq/L (ref 135–145)

## 2021-09-10 LAB — TSH: TSH: 0.71 u[IU]/mL (ref 0.35–5.50)

## 2021-09-10 LAB — T4, FREE: Free T4: 0.52 ng/dL — ABNORMAL LOW (ref 0.60–1.60)

## 2021-09-10 NOTE — Progress Notes (Signed)
Name: Monica Jacobs  MRN/ DOB: [id number], 1991/02/08    Age/ Sex: 30 y.o., female     PCP: Inda Coke, PA   Reason for Endocrinology Evaluation: Pituitary microadenoma      Initial Endocrinology Clinic Visit: 02/12/2021    PATIENT IDENTIFIER: Monica Jacobs is a 31 y.o., female with a past medical history of .Pituitary microadenoma  She has followed with [facility] since 02/12/21 for consultative assistance with management of her Pituitary microadenoma .      HISTORICAL SUMMARY:   During evaluation for oligomenorrhea  In 10/2020 she was noted to have elevated prolactin 86.1 ng/mL which prompted a brain MRI revealing a pituitary microadenoma of 8 mm .     She is S/P C-section 07/27/2019 , she pumped for ~ 6 months post delivery , but continues to have nipple discharge until 11/2020  Her prolactin level was slightly elevated at our clinic at 33.3 NG/mL, she was started on cabergoline in 01/2021   Prior to pregnancy she had regular periods but since delivery she noted irregular periods. 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 She has never used smokeless tobacco. She reports current alcohol use. She reports that she does not use drugs. Family History:  Family History  Problem Relation Age of Onset   Anxiety disorder Mother    Hypercholesterolemia Mother    ADD / ADHD Mother    Cancer Father        skin   Heart attack Maternal Grandmother    Mental illness Sister    Diabetes Maternal Grandfather    Heart disease Maternal Grandfather    Colon cancer Neg Hx    Breast cancer Neg Hx    Prostate cancer Neg Hx      HOME MEDICATIONS: Allergies as of 09/10/2021   No Known Allergies      Medication List        Accurate as of September 10, 2021  1:54 PM. If you have any questions, ask your nurse or doctor.          cabergoline 0.5 MG tablet Commonly known as: DOSTINEX Take 0.5 tablets (0.25 mg total) by mouth 2 (two) times a week.   methylphenidate 27 MG CR tablet Commonly known as: CONCERTA Take 27 mg by mouth daily.   zolpidem 10 MG tablet Commonly known as: AMBIEN Take 10  mg by mouth at bedtime.          OBJECTIVE:   PHYSICAL EXAM: VS: BP 122/80 (BP Location: Left Arm, Patient Position: Sitting, Cuff Size: Small)   Pulse 83   Ht 5\' 4"  (1.626 m)   Wt 176 lb (79.8 kg)   SpO2 99%   BMI 30.21 kg/m    EXAM: General: Pt appears well and is in NAD  Neck: General: Supple without adenopathy. Thyroid: Thyroid size normal.  No goiter or nodules appreciated.   Lungs: Clear with good BS bilat with no rales, rhonchi, or wheezes  Heart: Auscultation: RRR.  Abdomen: Normoactive bowel sounds, soft, nontender, without masses or organomegaly palpable  Extremities:  BL LE: No pretibial edema normal ROM and strength.  Mental Status: Judgment, insight: Intact Mood and affect: No depression, anxiety, or agitation     DATA REVIEWED:   Latest Reference Range  & Units 09/10/21 14:09  Sodium 135 - 145 mEq/L 140  Potassium 3.5 - 5.1 mEq/L 3.8  Chloride 96 - 112 mEq/L 105  CO2 19 - 32 mEq/L 25  Glucose 70 - 99 mg/dL 104 (H)  BUN 6 - 23 mg/dL 12  Creatinine 0.40 - 1.20 mg/dL 0.85  Calcium 8.4 - 10.5 mg/dL 9.1  GFR >60.00 mL/min 90.53    Latest Reference Range & Units 09/10/21 14:09  Prolactin ng/mL 25.7  Glucose 70 - 99 mg/dL 104 (H)  TSH 0.35 - 5.50 uIU/mL 0.71  T4,Free(Direct) 0.60 - 1.60 ng/dL 0.52 (L)    Brain MRI 12/24/2020     There is a 7 x 8 x 6 mm T2 hyperintense (series 5, image 7) and hypoenhancing lesion in the posterior left pituitary gland. The lesion appears to have some enhancement along its periphery, which doesn't change substantially with dynamic contrast. The central portion lesion is largely hypoenhancing on dynamic contrast. Resulting slight upward convexity of the left pituitary. Infundibulum is within normal limits and essentially midline.      Approximately 8 mm hypoenhancing lesion in the posterior left pituitary. Differential considerations include a pituitary microadenoma (given provided clinical history and suggestion of some enhancement along the periphery of the lesion) versus a pituitary cyst (given T2 hyperintensity and little enhancement centrally with dynamic contrast).      ASSESSMENT / PLAN / RECOMMENDATIONS:   Pituitary Microadenoma:   - No local symptoms - Will repeat MRI in 12/2021 - Pituitary hormones, have all come back normal except prolactin in the past  -  Update visual field normal 02/2021       2. Prolactinoma :     - She has been out of cabergoline for ~ 2 weeks , despite refills sent until 10/2021  - Prolactin is upper normal, will continue to hold off on cabergoline   Medication Hold Cabergoline     3. Low Serum FT4 :  - TSh is normal, no intervention, will recheck in 6 weeks      F/U in 6 months  Labs in 6 weeks   Signed electronically by: Mack Guise, MD  [facility]  [address] [address] [facility] [address] [place], [place] [address] Phone: [phone] FAX: [phone]      CC: Inda Coke, [place] [address] Phone: [phone]  Fax: [phone]   Return to Endocrinology clinic as below: No future appointments.

## 2021-09-11 LAB — PROLACTIN: Prolactin: 25.7 ng/mL

## 2021-09-12 DIAGNOSIS — F411 Generalized anxiety disorder: Secondary | ICD-10-CM | POA: Diagnosis not present

## 2021-09-12 DIAGNOSIS — F32A Depression, unspecified: Secondary | ICD-10-CM | POA: Diagnosis not present

## 2021-09-16 ENCOUNTER — Encounter: Payer: Self-pay | Admitting: Internal Medicine

## 2021-09-17 ENCOUNTER — Other Ambulatory Visit: Payer: Self-pay | Admitting: Internal Medicine

## 2021-09-17 DIAGNOSIS — D352 Benign neoplasm of pituitary gland: Secondary | ICD-10-CM

## 2021-09-29 DIAGNOSIS — F32A Depression, unspecified: Secondary | ICD-10-CM | POA: Diagnosis not present

## 2021-09-29 DIAGNOSIS — F411 Generalized anxiety disorder: Secondary | ICD-10-CM | POA: Diagnosis not present

## 2021-10-15 DIAGNOSIS — F32A Depression, unspecified: Secondary | ICD-10-CM | POA: Diagnosis not present

## 2021-10-15 DIAGNOSIS — F411 Generalized anxiety disorder: Secondary | ICD-10-CM | POA: Diagnosis not present

## 2021-10-29 ENCOUNTER — Other Ambulatory Visit: Payer: Self-pay

## 2021-10-29 ENCOUNTER — Other Ambulatory Visit (INDEPENDENT_AMBULATORY_CARE_PROVIDER_SITE_OTHER): Payer: BC Managed Care – PPO

## 2021-10-29 DIAGNOSIS — E221 Hyperprolactinemia: Secondary | ICD-10-CM

## 2021-10-29 DIAGNOSIS — D352 Benign neoplasm of pituitary gland: Secondary | ICD-10-CM

## 2021-10-29 LAB — T4, FREE: Free T4: 0.67 ng/dL (ref 0.60–1.60)

## 2021-10-29 LAB — TSH: TSH: 1.71 u[IU]/mL (ref 0.35–5.50)

## 2021-10-30 ENCOUNTER — Encounter: Payer: Self-pay | Admitting: Internal Medicine

## 2021-10-30 LAB — HCG, SERUM, QUALITATIVE: Preg, Serum: NEGATIVE

## 2021-10-30 LAB — PROLACTIN: Prolactin: 28.2 ng/mL

## 2021-11-07 DIAGNOSIS — F32A Depression, unspecified: Secondary | ICD-10-CM | POA: Diagnosis not present

## 2021-11-07 DIAGNOSIS — F411 Generalized anxiety disorder: Secondary | ICD-10-CM | POA: Diagnosis not present

## 2021-11-24 DIAGNOSIS — F411 Generalized anxiety disorder: Secondary | ICD-10-CM | POA: Diagnosis not present

## 2021-11-24 DIAGNOSIS — F32A Depression, unspecified: Secondary | ICD-10-CM | POA: Diagnosis not present

## 2021-11-26 DIAGNOSIS — Z79899 Other long term (current) drug therapy: Secondary | ICD-10-CM | POA: Diagnosis not present

## 2021-11-26 DIAGNOSIS — F900 Attention-deficit hyperactivity disorder, predominantly inattentive type: Secondary | ICD-10-CM | POA: Diagnosis not present

## 2021-11-26 DIAGNOSIS — F902 Attention-deficit hyperactivity disorder, combined type: Secondary | ICD-10-CM | POA: Diagnosis not present

## 2021-12-12 DIAGNOSIS — F909 Attention-deficit hyperactivity disorder, unspecified type: Secondary | ICD-10-CM | POA: Insufficient documentation

## 2021-12-12 DIAGNOSIS — Z1151 Encounter for screening for human papillomavirus (HPV): Secondary | ICD-10-CM | POA: Diagnosis not present

## 2021-12-12 DIAGNOSIS — Z6831 Body mass index (BMI) 31.0-31.9, adult: Secondary | ICD-10-CM | POA: Diagnosis not present

## 2021-12-12 DIAGNOSIS — Z01419 Encounter for gynecological examination (general) (routine) without abnormal findings: Secondary | ICD-10-CM | POA: Diagnosis not present

## 2021-12-12 DIAGNOSIS — Z124 Encounter for screening for malignant neoplasm of cervix: Secondary | ICD-10-CM | POA: Diagnosis not present

## 2021-12-12 LAB — RESULTS CONSOLE HPV: CHL HPV: NEGATIVE

## 2021-12-15 DIAGNOSIS — F32A Depression, unspecified: Secondary | ICD-10-CM | POA: Diagnosis not present

## 2021-12-15 DIAGNOSIS — F411 Generalized anxiety disorder: Secondary | ICD-10-CM | POA: Diagnosis not present

## 2021-12-15 LAB — HM PAP SMEAR

## 2021-12-29 DIAGNOSIS — F32A Depression, unspecified: Secondary | ICD-10-CM | POA: Diagnosis not present

## 2021-12-29 DIAGNOSIS — F411 Generalized anxiety disorder: Secondary | ICD-10-CM | POA: Diagnosis not present

## 2022-01-19 DIAGNOSIS — F411 Generalized anxiety disorder: Secondary | ICD-10-CM | POA: Diagnosis not present

## 2022-01-19 DIAGNOSIS — F32A Depression, unspecified: Secondary | ICD-10-CM | POA: Diagnosis not present

## 2022-01-22 ENCOUNTER — Encounter: Payer: Self-pay | Admitting: Internal Medicine

## 2022-01-22 ENCOUNTER — Ambulatory Visit: Payer: BC Managed Care – PPO | Admitting: Internal Medicine

## 2022-01-22 VITALS — BP 118/74 | HR 72 | Ht 64.0 in | Wt 180.0 lb

## 2022-01-22 DIAGNOSIS — R7989 Other specified abnormal findings of blood chemistry: Secondary | ICD-10-CM | POA: Diagnosis not present

## 2022-01-22 DIAGNOSIS — E221 Hyperprolactinemia: Secondary | ICD-10-CM

## 2022-01-22 DIAGNOSIS — D352 Benign neoplasm of pituitary gland: Secondary | ICD-10-CM | POA: Diagnosis not present

## 2022-01-22 LAB — T4, FREE: Free T4: 0.77 ng/dL (ref 0.60–1.60)

## 2022-01-22 LAB — CORTISOL: Cortisol, Plasma: 9.2 ug/dL

## 2022-01-22 LAB — LUTEINIZING HORMONE: LH: 19.13 m[IU]/mL

## 2022-01-22 LAB — TSH: TSH: 1.76 u[IU]/mL (ref 0.35–5.50)

## 2022-01-22 LAB — FOLLICLE STIMULATING HORMONE: FSH: 4.4 m[IU]/mL

## 2022-01-22 NOTE — Progress Notes (Signed)
? ?Name: Monica Jacobs  ?MRN/ DOB: [id number], Jul 02, 1991    ?Age/ Sex: 31 y.o., female   ? ? ?PCP: Inda Coke, PA   ?Reason for Endocrinology Evaluation: Pituitary microadenoma   ?   ?Initial Endocrinology Clinic Visit: 02/12/2021  ? ? ?PATIENT IDENTIFIER: Monica Jacobs is a 31 y.o., female with a past medical history of .Pituitary microadenoma  She has followed with [facility] since 02/12/21 for consultative assistance with management of her Pituitary microadenoma .  ? ? ? ? ?HISTORICAL SUMMARY:  ? ?During evaluation for oligomenorrhea  In 10/2020 she was noted to have elevated prolactin 86.1 ng/mL which prompted a brain MRI revealing a pituitary microadenoma of 8 mm . ?  ?  ?She is S/P C-section 07/27/2019 , she pumped for ~ 6 months post delivery , but continues to have nipple discharge until 11/2020 ? ?Her prolactin level was slightly elevated at our clinic at 33.3 NG/mL, she was started on cabergoline in 01/2021 ?  ?Prior to pregnancy she had regular periods but since delivery she noted irregular periods. Menarche at age 89 ?  ?No FH of pituitary disease  ? ? ?On her 08/2021 visit the patient ran out of cabergoline, and we opted to remain off since her prolactin level was within normal range. ? ?SUBJECTIVE:  ? ?Today (01/23/2022):  Monica Jacobs is here for a follow-up on pituitary microadenoma ? ?Denies vision changes  ?Started headaches ~ 2 months ago , behind her right eye ball.  ?Has lasik sx 08/2021 ?Has constipation and diarrhea , bloating  ?Started nipple discharge  and breast engorgement , minimal only during menstruations ?LMP  2 weeks ago - has become irregular over the past 3 months  ?She has polydipsia ?She has noted 10 lbs weight gain  ? ?Last eye exam 2023 ? ?Planning on conceiving by next year, uses condoms ? ?Started collagen and Biotin due hair thinning , she did not take it ~2-3 days  ? ? ? ? ? ?HISTORY:  ?Past Medical History:  ?Past Medical History:  ?Diagnosis Date  ?  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 Depression   ? Environmental allergies   ? ?Past Surgical History:  ?Past Surgical History:  ?Procedure Laterality Date  ? CESAREAN SECTION N/A 07/27/2019  ? Procedure: CESAREAN SECTION;  Surgeon: Linda Hedges, DO;  Location: [facility];  Service: Obstetrics;  Laterality: N/A;  Primary ?edc 08/03/19 ?NKDA ?[place]  ? NO PAST SURGERIES    ? ?Social History:  reports that she has never smoked. She has never used smokeless tobacco. She reports current alcohol use. She reports that she does not use drugs. ?Family History:  ?Family History  ?Problem Relation Age of Onset  ? Anxiety disorder Mother   ? Hypercholesterolemia Mother   ? ADD / ADHD Mother   ? Cancer Father   ?     skin  ? Heart attack Maternal Grandmother   ? Mental illness Sister   ? Diabetes Maternal Grandfather   ? Heart disease Maternal Grandfather   ? Colon cancer Neg Hx   ? Breast cancer Neg Hx   ? Prostate cancer Neg Hx   ? ? ? ?HOME MEDICATIONS: ?Allergies as of 01/22/2022   ?No Known Allergies ?  ? ?  ?Medication List  ?  ? ?  ? Accurate as of January 22, 2022 11:59 PM. If you have any questions, ask your nurse or doctor.  ?  ?  ? ?  ? ?  STOP taking these medications   ? ?cabergoline 0.5 MG tablet ?Commonly known as: DOSTINEX ?Stopped by: Dorita Sciara, MD ?  ?zolpidem 10 MG tablet ?Commonly known as: AMBIEN ?Stopped by: Dorita Sciara, MD ?  ? ?  ? ?TAKE these medications   ? ?methylphenidate 27 MG CR tablet ?Commonly known as: CONCERTA ?Take 27 mg by mouth daily. ?  ? ?  ? ? ? ? ?OBJECTIVE:  ? ?PHYSICAL EXAM: ?VS: BP 118/74 (BP Location: Left Arm, Patient Position: Sitting, Cuff Size: Small)   Pulse 72   Ht '5\' 4"'$  (1.626 m)   Wt 180 lb (81.6 kg)   SpO2 99%   BMI 30.90 kg/m?   ? ?EXAM: ?General: Pt appears well and is in NAD  ?Neck: General: Supple without adenopathy. ?Thyroid: Thyroid size normal.  No goiter or nodules appreciated.   ?Lungs: Clear with good BS bilat with no rales,  rhonchi, or wheezes  ?Heart: Auscultation: RRR.  ?Abdomen: Normoactive bowel sounds, soft, nontender, without masses or organomegaly palpable  ?Extremities:  ?BL LE: No pretibial edema normal ROM and strength.  ?Mental Status: Judgment, insight: Intact ?Mood and affect: No depression, anxiety, or agitation  ? ? ? ?DATA REVIEWED: ? ? Latest Reference Range & Units 01/22/22 08:48  ?LH mIU/mL 19.13  ?FSH mIU/ML 4.4  ?Prolactin ng/mL 37.9 (H)  ? ? ? ? Latest Reference Range & Units 09/10/21 14:09  ?Sodium 135 - 145 mEq/L 140  ?Potassium 3.5 - 5.1 mEq/L 3.8  ?Chloride 96 - 112 mEq/L 105  ?CO2 19 - 32 mEq/L 25  ?Glucose 70 - 99 mg/dL 104 (H)  ?BUN 6 - 23 mg/dL 12  ?Creatinine 0.40 - 1.20 mg/dL 0.85  ?Calcium 8.4 - 10.5 mg/dL 9.1  ?GFR >60.00 mL/min 90.53  ? ? Latest Reference Range & Units 09/10/21 14:09  ?Prolactin ng/mL 25.7  ?Glucose 70 - 99 mg/dL 104 (H)  ?TSH 0.35 - 5.50 uIU/mL 0.71  ?T4,Free(Direct) 0.60 - 1.60 ng/dL 0.52 (L)  ? ? ?Brain MRI 12/24/2020 ?  ?  ?There is a 7 x 8 x 6 mm ?T2 hyperintense (series 5, image 7) and hypoenhancing lesion in the ?posterior left pituitary gland. The lesion appears to have some ?enhancement along its periphery, which doesn't change substantially ?with dynamic contrast. The central portion lesion is largely ?hypoenhancing on dynamic contrast. Resulting slight upward convexity ?of the left pituitary. Infundibulum is within normal limits and ?essentially midline. ?  ?  ? Approximately 8 mm hypoenhancing lesion in the posterior left ?pituitary. Differential considerations include a pituitary ?microadenoma (given provided clinical history and suggestion of some ?enhancement along the periphery of the lesion) versus a pituitary ?cyst (given T2 hyperintensity and little enhancement centrally with ?dynamic contrast). ?  ?  ? ?ASSESSMENT / PLAN / RECOMMENDATIONS:  ? ?Pituitary Microadenoma: ?  ?- No local symptoms ?- Will repeat MRI in 12/2021 ?- Pituitary hormones, have all come back normal  except prolactin including estradiol, FSH, TFTs, and cortisol ?-  Update visual field normal 02/2021 ? - ?  ?  ?2. Prolactinoma : ?  ?  ?-She has been off cabergoline since 08/2021 up until 10/2021 prolactin level was normal ?-Repeat prolactin levels have increased again ?-We will restart cabergoline ? ? ? ?Medication ?Restart cabergoline 0.5 mg, half a tablet twice weekly ?  ? ?3. Low Serum FT4 : ? ?-Resolved ?-I do believe this was an assay interference ? ?  ?Follow-up in 4 months ? ?Signed electronically by: ?Elenora Gamma  Janey Petron, MD ? ?[facility]  ?[facility] ?[address] ?[place], [place] [address] ?Phone: [phone] ?FAX: [phone]  ? ? ? ? ?CC: ?Inda Coke, [place] ?[place] [address] ?Phone: [phone]  ?Fax: [phone] ? ? ?Return to Endocrinology clinic as below: ?Future Appointments  ?Date Time Provider Bethany  ?06/09/2022  8:30 AM Kynzie Polgar, Melanie Crazier, MD [id number] None  ? ?  ? ?

## 2022-01-23 MED ORDER — CABERGOLINE 0.5 MG PO TABS: 0.2500 mg | ORAL_TABLET | ORAL | 3 refills | Status: DC

## 2022-01-28 LAB — INSULIN-LIKE GROWTH FACTOR
IGF-I, LC/MS: 220 ng/mL (ref 53–331)
Z-Score (Female): 0.9 SD (ref ?–2.0)

## 2022-01-28 LAB — EXTRA SPECIMEN

## 2022-01-28 LAB — ACTH: C206 ACTH: 33 pg/mL (ref 6–50)

## 2022-01-28 LAB — PROLACTIN: Prolactin: 37.9 ng/mL — ABNORMAL HIGH

## 2022-01-28 LAB — ESTRADIOL: Estradiol: 57 pg/mL

## 2022-02-03 ENCOUNTER — Encounter: Payer: Self-pay | Admitting: Physician Assistant

## 2022-02-03 ENCOUNTER — Ambulatory Visit: Payer: BC Managed Care – PPO | Admitting: Physician Assistant

## 2022-02-03 VITALS — BP 120/74 | HR 71 | Temp 98.0°F | Ht 64.0 in | Wt 179.4 lb

## 2022-02-03 DIAGNOSIS — E559 Vitamin D deficiency, unspecified: Secondary | ICD-10-CM | POA: Diagnosis not present

## 2022-02-03 DIAGNOSIS — R251 Tremor, unspecified: Secondary | ICD-10-CM | POA: Diagnosis not present

## 2022-02-03 DIAGNOSIS — M2550 Pain in unspecified joint: Secondary | ICD-10-CM

## 2022-02-03 DIAGNOSIS — R635 Abnormal weight gain: Secondary | ICD-10-CM | POA: Diagnosis not present

## 2022-02-03 DIAGNOSIS — E669 Obesity, unspecified: Secondary | ICD-10-CM

## 2022-02-03 LAB — COMPREHENSIVE METABOLIC PANEL
ALT: 18 U/L (ref 0–35)
AST: 20 U/L (ref 0–37)
Albumin: 4.3 g/dL (ref 3.5–5.2)
Alkaline Phosphatase: 49 U/L (ref 39–117)
BUN: 11 mg/dL (ref 6–23)
CO2: 25 mEq/L (ref 19–32)
Calcium: 9.6 mg/dL (ref 8.4–10.5)
Chloride: 105 mEq/L (ref 96–112)
Creatinine, Ser: 0.81 mg/dL (ref 0.40–1.20)
GFR: 95.65 mL/min (ref >60.00)
Glucose, Bld: 80 mg/dL (ref 70–99)
Potassium: 4.3 mEq/L (ref 3.5–5.1)
Sodium: 139 mEq/L (ref 135–145)
Total Bilirubin: 0.4 mg/dL (ref 0.2–1.2)
Total Protein: 7.2 g/dL (ref 6.0–8.3)

## 2022-02-03 LAB — VITAMIN D 25 HYDROXY (VIT D DEFICIENCY, FRACTURES): VITD: 18.13 ng/mL — ABNORMAL LOW (ref 30.00–100.00)

## 2022-02-03 LAB — VITAMIN B12: Vitamin B-12: 365 pg/mL (ref 211–911)

## 2022-02-03 LAB — HEMOGLOBIN A1C: Hgb A1c MFr Bld: 5.4 % (ref 4.6–6.5)

## 2022-02-03 NOTE — Patient Instructions (Addendum)
It was great to see you! ? ?Update blood work today ?Referral to a dietitian, they will call you ? ?If your tremor worsens, let me know ? ?If you want to start [facility], let me know ? ?Take care, ? ?Inda Coke PA-C  ?

## 2022-02-03 NOTE — Progress Notes (Signed)
Monica Jacobs is a 31 y.o. female here for weight gain. ? ?History of Present Illness:  ? ?Chief Complaint  ?Patient presents with  ? Weight Gain  ?  Pt c/o weight gain over the past 3 months, has gained 10 pounds even with diet and exercise.  ? ? ?HPI ? ?Weight Gain  ?Monica Jacobs presents with concerns due to recent weight gain of 10 pounds over the past 3 months. States that she is confused as to how she is still gaining weight despite adjusting her diet and exercise regimen. Reports that in March she increased her exercise regimen to cardio and strength training 4-5 days x week. Pt does have a hx of pituitary microadenoma and has been following up with Dr. Kelton Pillar for this issue. On 01/22/22, she re-visited Dr. Kelton Pillar for a f/u. At that time she was no longer taking her cabergoline 0.5 mg daily due to running out and was told to continue to hold medication since prolactin levels were improved following her last visit on 09/10/21.  ? ?Unfortunately due to finding that her prolactin levels had increased once more, she was restarted on the medication following her 01/22/22 visit. Upon further research she has found that increased prolactin levels could cause weight gain, but once starting medication people have been found to lose the weight. Although she is monitoring this to see if this is correct, she is still experiencing frustration with her body. Currently she is wondering what more could be done to assist her with weight loss, especially since she is trying to get to her pre-pregnancy weight prior to trying to conceive another child. Pt expresses hesitation in trialing a weight loss medication at this time due to other medications.  ? ?She also describes back and joint pain, she attributes this with her weight gain. Does not take medication for this. ? ?Hand Tremors ?In addition to her weight gain, she has recently been experiencing hand tremors for the past couple of weeks. States that one day she was  trying to type something in her phone when she found that her hand was moving involuntarily. This alone caused her to not be able to use her phone and lasted for a couple of minutes. Pt states that her father experiences the same thing but upon finding it wasn't parkinson's disease he didn't get it further evaluated. At this time she would like to have this further evaluated and see if it could be related to her weight issue.  ? ? ?Past Medical History:  ?Diagnosis Date  ? 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 Depression   ? Environmental allergies   ? ?  ?Social History  ? ?Tobacco Use  ? Smoking status: Never  ? Smokeless tobacco: Never  ?Vaping Use  ? Vaping Use: Never used  ?Substance Use Topics  ? Alcohol use: Yes  ?  Comment: Occasionally  ? Drug use: No  ? ? ?Past Surgical History:  ?Procedure Laterality Date  ? CESAREAN SECTION N/A 07/27/2019  ? Procedure: CESAREAN SECTION;  Surgeon: Linda Hedges, DO;  Location: [facility];  Service: Obstetrics;  Laterality: N/A;  Primary ?edc 08/03/19 ?NKDA ?[place]  ? NO PAST SURGERIES    ? ? ?Family History  ?Problem Relation Age of Onset  ? Anxiety disorder Mother   ? Hypercholesterolemia Mother   ? ADD / ADHD Mother   ? Cancer Father   ?     skin  ? Heart attack Maternal Grandmother   ?  Mental illness Sister   ? Diabetes Maternal Grandfather   ? Heart disease Maternal Grandfather   ? Colon cancer Neg Hx   ? Breast cancer Neg Hx   ? Prostate cancer Neg Hx   ? ? ?No Known Allergies ? ?Current Medications:  ? ?Current Outpatient Medications:  ?  cabergoline (DOSTINEX) 0.5 MG tablet, Take 0.5 tablets (0.25 mg total) by mouth 2 (two) times a week., Disp: 13 tablet, Rfl: 3 ?  COLLAGEN PO, Take 1 Scoop by mouth daily in the afternoon. Collagen and Biotin togetther, Disp: , Rfl:  ?  methylphenidate 27 MG PO CR tablet, Take 27 mg by mouth daily., Disp: , Rfl:  ?  Multiple Vitamin (MULTIVITAMIN) tablet, Take 1 tablet by mouth daily., Disp: , Rfl:   ? ?Review of Systems:   ? ?ROS ?Negative unless otherwise specified per HPI. ?Vitals:  ? ?Vitals:  ? 02/03/22 0821  ?BP: 120/74  ?Pulse: 71  ?Temp: 98 ?F (36.7 ?C)  ?TempSrc: Temporal  ?SpO2: 100%  ?Weight: 179 lb 6.1 oz (81.4 kg)  ?Height: '5\' 4"'$  (1.626 m)  ?   ?Body mass index is 30.79 kg/m?. ? ?Physical Exam:  ? ?Physical Exam ?Vitals and nursing note reviewed.  ?Constitutional:   ?   General: She is not in acute distress. ?   Appearance: She is well-developed. She is not ill-appearing or toxic-appearing.  ?Cardiovascular:  ?   Rate and Rhythm: Normal rate and regular rhythm.  ?   Pulses: Normal pulses.  ?   Heart sounds: Normal heart sounds, S1 normal and S2 normal.  ?Pulmonary:  ?   Effort: Pulmonary effort is normal.  ?   Breath sounds: Normal breath sounds.  ?Skin: ?   General: Skin is warm and dry.  ?Neurological:  ?   Mental Status: She is alert.  ?   GCS: GCS eye subscore is 4. GCS verbal subscore is 5. GCS motor subscore is 6.  ?   Comments: No obvious tremor on my exam  ?Psychiatric:     ?   Speech: Speech normal.     ?   Behavior: Behavior normal. Behavior is cooperative.  ? ? ?Assessment and Plan:  ? ?Weight gain ?No red flags ?Will refer to dietitian per patient's request ?Discussed possibility of starting [facility]-- provided patient handout information  ?Patient will reach out if start of medication is desired  ?Continue healthy eating and regular exercise  ? ?Tremor ?No red flags and no presence of tremor today ?Continue to monitor-- low threshold to refer to neurology if persists/worsens ?Will check B12  ?She is getting updated MRI of brain soon per Endo ?Follow up if new/worsening symptoms or concerns occur ? ?Vitamin D deficiency ?Update labs, will make recommendations accordingly  ? ?Arthralgia, unspecified joint ?No red flags ?Due to ongoing multitude of sx, will also check ANA and RF. ?Consider referral to sports medicine or PT if persists or worsens. ? ?Obesity, unspecified classification, unspecified obesity type,  unspecified whether serious comorbidity present ?Consider [phone], handout provided ?RD referral placed ?Continue exercise and healthy eating ? ? ?I,Havlyn C Ratchford,acting as a scribe for [employer], PA.,have documented all relevant documentation on the behalf of Inda Coke, PA,as directed by  Inda Coke, PA while in the presence of Inda Coke, [place]. ? ?Time spent with patient today was 51 minutes which consisted of chart review, discussing diagnosis and recent endocrinology evaluateion, work up, treatment answering questions and documentation. ? ? ?Inda Coke, PA-C ? ?

## 2022-02-05 ENCOUNTER — Other Ambulatory Visit: Payer: Self-pay | Admitting: Physician Assistant

## 2022-02-05 ENCOUNTER — Encounter: Payer: Self-pay | Admitting: Physician Assistant

## 2022-02-05 MED ORDER — VITAMIN D (ERGOCALCIFEROL) 1.25 MG (50000 UNIT) PO CAPS: 50000.0000 [IU] | ORAL_CAPSULE | ORAL | 0 refills | Status: DC

## 2022-02-06 ENCOUNTER — Encounter: Payer: BC Managed Care – PPO | Attending: Physician Assistant | Admitting: Dietician

## 2022-02-06 ENCOUNTER — Encounter: Payer: Self-pay | Admitting: Dietician

## 2022-02-06 DIAGNOSIS — Z713 Dietary counseling and surveillance: Secondary | ICD-10-CM | POA: Diagnosis not present

## 2022-02-06 DIAGNOSIS — Z6831 Body mass index (BMI) 31.0-31.9, adult: Secondary | ICD-10-CM | POA: Diagnosis not present

## 2022-02-06 DIAGNOSIS — E669 Obesity, unspecified: Secondary | ICD-10-CM | POA: Diagnosis not present

## 2022-02-06 DIAGNOSIS — D352 Benign neoplasm of pituitary gland: Secondary | ICD-10-CM

## 2022-02-06 LAB — RHEUMATOID FACTOR: Rheumatoid fact SerPl-aCnc: <14 IU/mL (ref <14)

## 2022-02-06 LAB — ANA: Anti Nuclear Antibody (ANA): POSITIVE — AB

## 2022-02-06 LAB — ANTI-NUCLEAR AB-TITER (ANA TITER): ANA Titer 1: 1:320 {titer} — ABNORMAL HIGH

## 2022-02-06 NOTE — Progress Notes (Signed)
Medical Nutrition Therapy  ?Appointment Start time:  [phone]  Appointment End time:  1010 ? ?Primary concerns today: Patient states that she gained 10 lbs in the past 3 months.  She wonders if the cabergoline is causing her weight gain but states that she did not gain the weight when she was on this medication 1 year ago.  She states that she is gaining despite during pure barre twice a week to 5 days per week. ?Complains of constipation/diarrhea and noted that the cabergoline can cause GI problems. ?Complains of bloating after a normal dinner.  Tried a Gluten Free diet for 3 days but this did not help.  She states that her sister feels best off gluten and dairy. ?She states that she has mild tremors in her hands which has worsened.  She has decreased her coffee to 1/2 cup. ? ?Referral diagnosis: obesity ?Preferred learning style: no preference indicated ?Learning readiness: contemplating, ready, change in progress ? ? ?NUTRITION ASSESSMENT  ? ?Anthropometrics  ?63" ?180 lbs   ?160 lbs prior to pregnancy 10/2018 ?Breastfeeding for 6 months until 01/2020. ?10 lb weight gain in the past 3 months ?She has tried multiple diet in the past, Noom, Weight Watcher's etc. ?Uses measuring cups to portion. ?Increased back and joint pain with exercise. ? ?Clinical ?Medical Hx: pituitary microadenoma (which increases her prolactin levels), anorexia (childhood as a Programme researcher, broadcasting/film/video) ?Medications: cabergoline, vitamin D ?Labs: 01/22/2022 Prolactin 37.9, Cortosol 9.2, 02/03/2022 vitamin B-12 365, vitamin D 18, A1C 5.4% ?Notable Signs/Symptoms: bloating, weight gain ? ?Lifestyle & Dietary Hx ?Patient lives with her husband and 3 yo. ?Patient does most of the cooking.  Loves vegetables and less meat (due to the environment).  Increased variety. ?Patient works for [employer]) [place].  Sedentary job. ?Disordered eating in the past related to increased rules and used to sneak food when she was in figure skating.  Has a much  healthier attitude about her body as an adult. ?Grows a garden "a lot of vegetables". ? ?Supplements: MVI, Vitamin D3, fiber supplement ?Sleep: 8 ?Stress / self-care: moderate ?Current average weekly physical activity: pure barre 2 days per week ? ?24-Hr Dietary Recall ?First Meal: 1/2 cup coffee with collagen, egg and toast OR avocado toast OR pancakes on the weekend OR protein meal bar AND fruit ?Snack: none ?Second Meal: leftovers and occasional sweet ?Snack: apple or snack size meal bar ?Third Meal: slice of kale, cheese egg pie OR homemade mac and cheese, vegetables OR roasted vegetable burritos OR spinach salad with ground chicken meatballs with feta and mint and occasional sweet (homemade chocolate chip cookies) ?Snack: dessert or apple and peanut butter or nutella ?Beverages: water, 1/2 coffee with collagen ? ?Estimated Energy Needs ?Calories: 1500 ? ?NUTRITION DIAGNOSIS  ?NB-1.1 Food and nutrition-related knowledge deficit As related to diet and weight.  As evidenced by patient report. ? ? ?NUTRITION INTERVENTION  ?Nutrition education (E-1) on the following topics:  ?History of disordered eating and current perception of body image and food habits ?Vitamin status and supplement options ?Probiotics as an option which improve GI symptoms in some ?Review of mindful eating ?Snacks and weight ?Benefits of staying active (improve insulin sensitivity, muscle mass, etc) and benefits of adding some weight bearing exercises ?Balance of meals and snacks ? ?Handouts Provided Include  ?My plate placemat ? ?Learning Style & Readiness for Change ?Teaching method utilized: Visual & Auditory  ?Demonstrated degree of understanding via: Teach Back  ?Barriers to learning/adherence to lifestyle change: none ? ?Plan: ?  Vitamin D3 ?Vitamin B-12 (Dissolvable or sublingual)  with optional Methylfolate ?Consider trying a probiotic ?Planned afternoon snack if you need this (small amount of protein and carbohydrate with  fiber). ? ?Choose carbohydrates that have fiber most often. ?1/2 your plate non starchy vegetables ?Lean protein (beans, tofu, chicken, [place], fish, eggs, low fat cheese, etc) ? ?Mindfulness: ? Choices ? Are you eating a snack because you are hungry or for another reason? ? Eat slowly and stop when you are satisfied.   ? Enjoy what you are eating away from distraction. ? ?Continue to be active. ?Consider adding some weight bearing exercises (weights/bands) ? ?MONITORING & EVALUATION ?Dietary intake, weekly physical activity prn ? ?Next Steps  ?Patient is to call for questions or appointment prn. ?

## 2022-02-07 ENCOUNTER — Encounter: Payer: Self-pay | Admitting: Physician Assistant

## 2022-02-09 ENCOUNTER — Other Ambulatory Visit: Payer: Self-pay | Admitting: Physician Assistant

## 2022-02-09 DIAGNOSIS — F411 Generalized anxiety disorder: Secondary | ICD-10-CM | POA: Diagnosis not present

## 2022-02-09 DIAGNOSIS — F32A Depression, unspecified: Secondary | ICD-10-CM | POA: Diagnosis not present

## 2022-02-09 DIAGNOSIS — R768 Other specified abnormal immunological findings in serum: Secondary | ICD-10-CM

## 2022-02-09 DIAGNOSIS — M2550 Pain in unspecified joint: Secondary | ICD-10-CM

## 2022-02-19 ENCOUNTER — Ambulatory Visit
Admission: RE | Admit: 2022-02-19 | Discharge: 2022-02-19 | Disposition: A | Discharge status: Home / Self Care | Payer: BC Managed Care – PPO | Source: Ambulatory Visit | Attending: Internal Medicine | Admitting: Internal Medicine

## 2022-02-19 DIAGNOSIS — D352 Benign neoplasm of pituitary gland: Secondary | ICD-10-CM

## 2022-02-19 DIAGNOSIS — C729 Malignant neoplasm of central nervous system, unspecified: Secondary | ICD-10-CM | POA: Diagnosis not present

## 2022-02-19 IMAGING — MR MR HEAD WO/W CM
13 of 19 series · 35 of 48 positions shown · IV contrast (multihance)
Comparison: MRI head [DATE].

CLINICAL DATA: Brain/CNS neoplasm, monitor Follow up on Pituitary
microadenoma , hx of elevated prolactin

EXAM:
MRI HEAD WITHOUT AND WITH CONTRAST
TECHNIQUE: Multiplanar, multiecho pulse sequences of the brain and surrounding
structures were obtained without and with intravenous contrast.
CONTRAST:  8mL MULTIHANCE GADOBENATE DIMEGLUMINE 529 MG/ML IV SOLN

[Series 2: T1 · sagittal · 5.0mm · 0.45mm/px · 3 of 22 slices shown (1 of 3)]
[im 1/22]
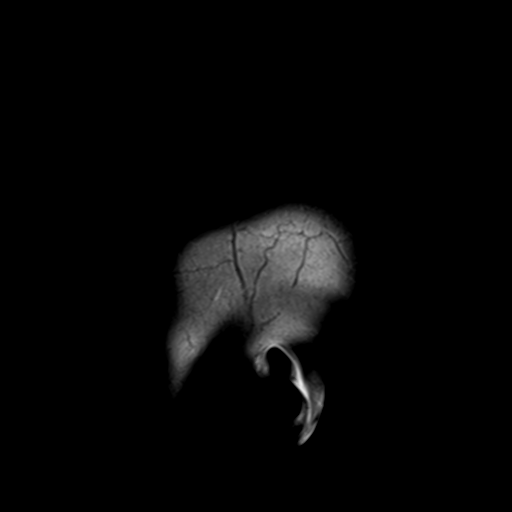
[im 11/22]
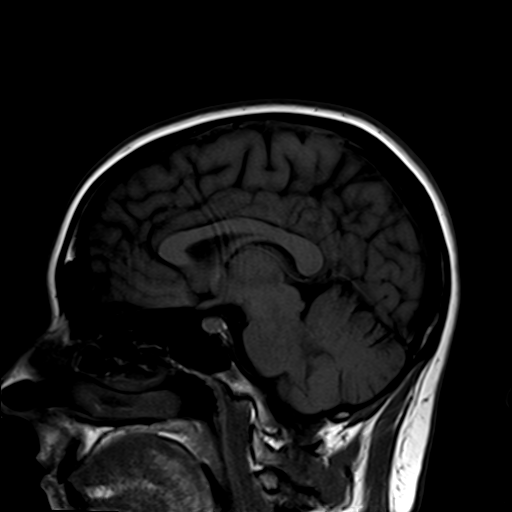
[im 22/22]
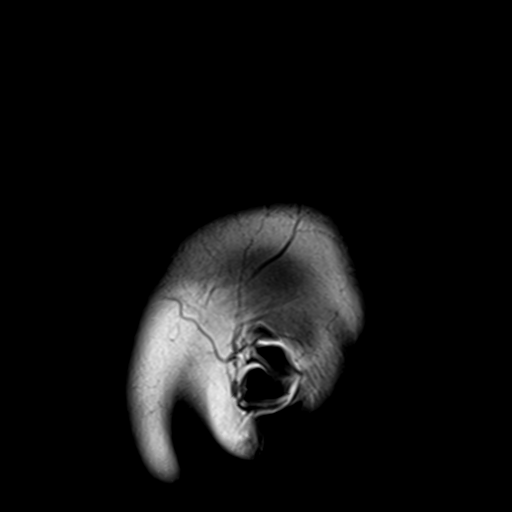

[Series 3: DWI · axial · 3.0mm · 1.80mm/px · z∈[-51,+95]mm · 11 of 100 slices shown]
[im 1/100]
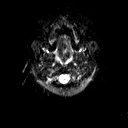
[im 10/100]
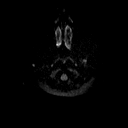
[im 20/100]
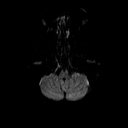
[im 30/100]
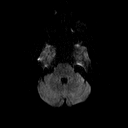
[im 40/100]
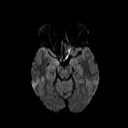
[im 50/100]
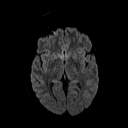
[im 60/100]
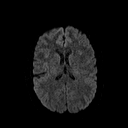
[im 70/100]
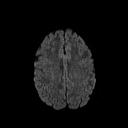
[im 80/100]
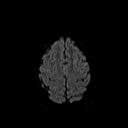
[im 90/100]
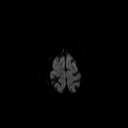
[im 100/100]
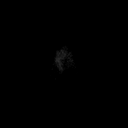

[Series 4: dwi_adc · axial · 3.0mm · 1.80mm/px · z∈[-51,+95]mm · 5 of 50 slices shown]
[im 1/50]
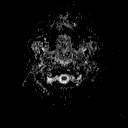
[im 13/50]
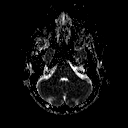
[im 25/50]
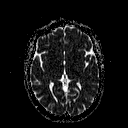
[im 37/50]
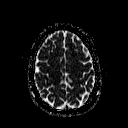
[im 50/50]
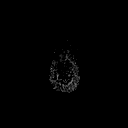

[Series 5: T2 · axial · 5.0mm · 0.36mm/px · z∈[-36,+98]mm · 2 of 22 slices shown (1 of 2)]
[im 1/22]
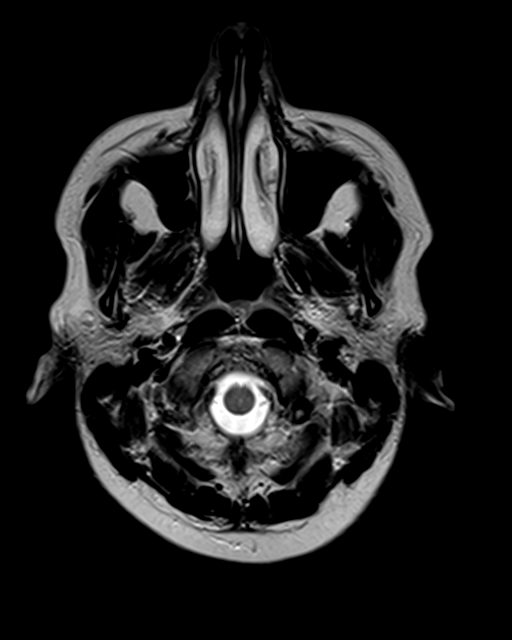
[im 22/22]
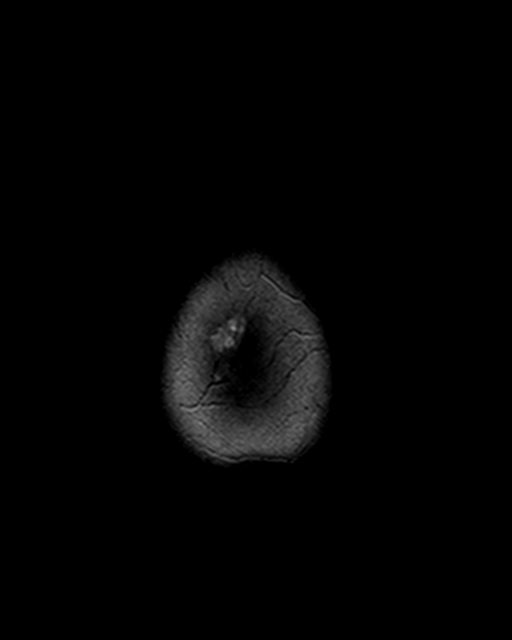

[Series 6: FLAIR · axial · 3.0mm · 0.45mm/px · z∈[-33,+100]mm · 3 of 30 slices shown]
[im 1/30]
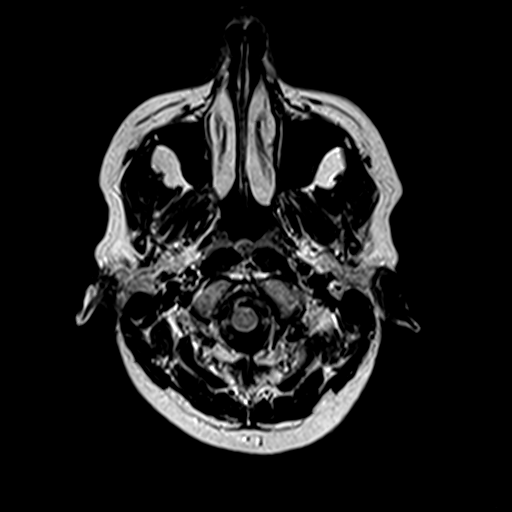
[im 15/30]
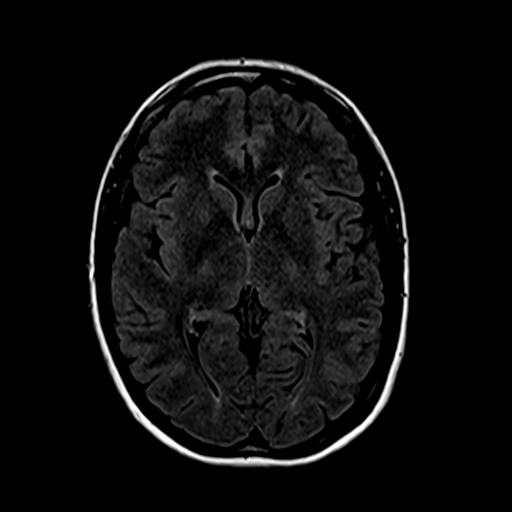
[im 30/30]
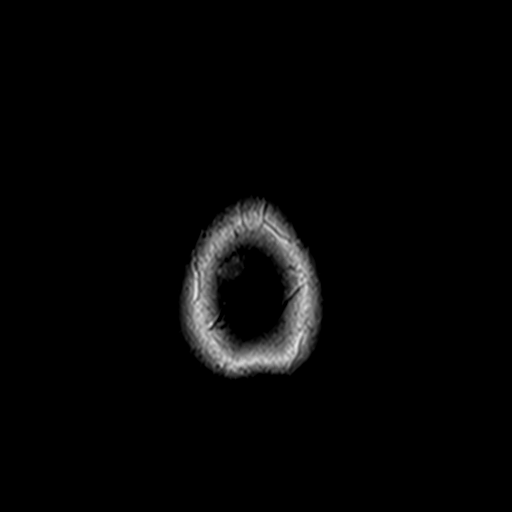

[Series 8: swi_images · axial · 4.0mm · 0.94mm/px · z∈[-35,+103]mm · 4 of 36 slices shown]
[im 1/36]
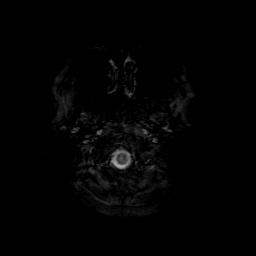
[im 12/36]
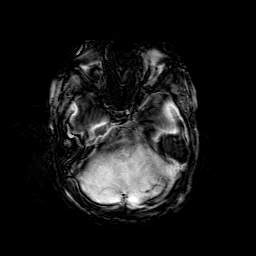
[im 24/36]
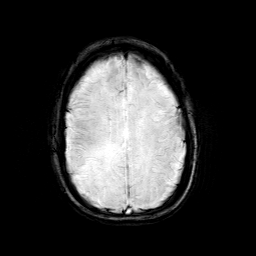
[im 36/36]
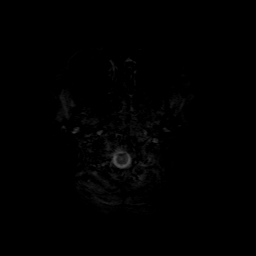

[Series 9: T1 · sagittal · 3.0mm · 0.33mm/px · 1 of 11 slices shown (2 of 3)]
[im 1/11]
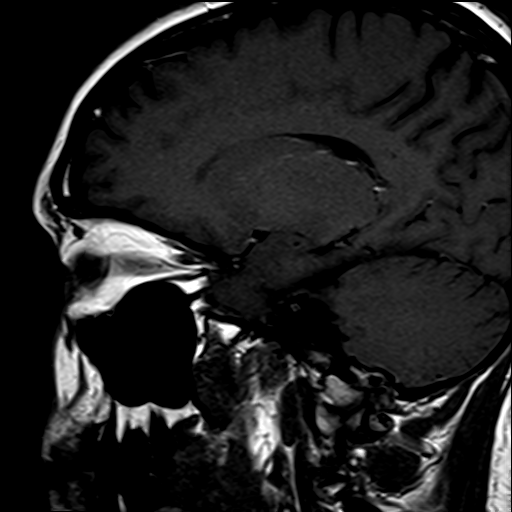

[Series 10: T1 · coronal · 3.0mm · 0.33mm/px · 1 of 11 slices shown (3 of 3)]
[im 1/11]
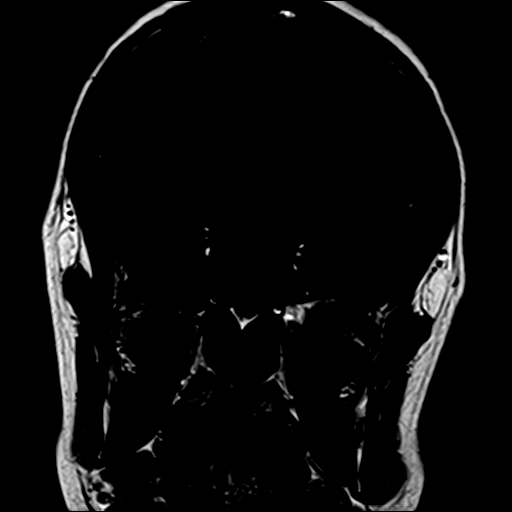

[Series 11: T2 · coronal · 3.0mm · 0.23mm/px · 1 of 11 slices shown (2 of 2)]
[im 1/11]
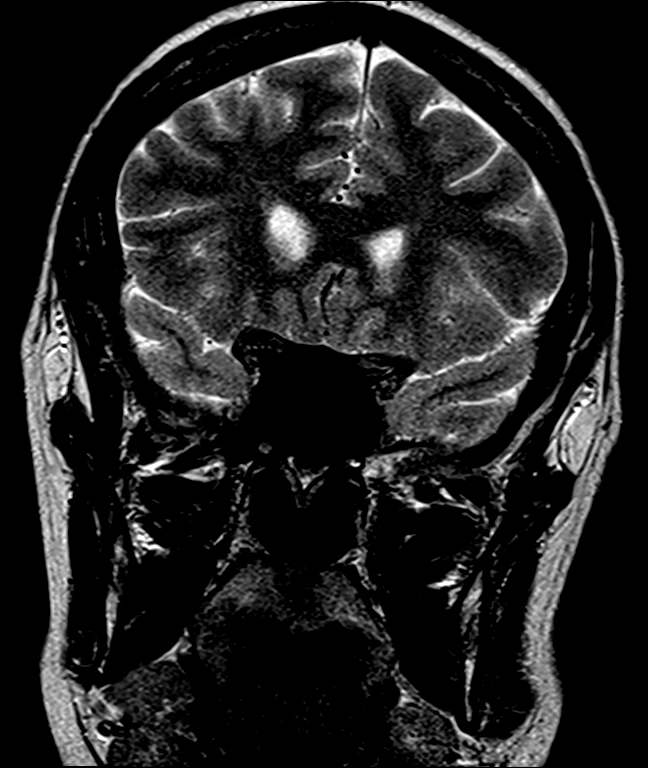

[Series 12: pre cor dynamic · coronal · non-contrast · 3.0mm · 0.35mm/px · 1 of 9 slices shown]
[im 1/9]
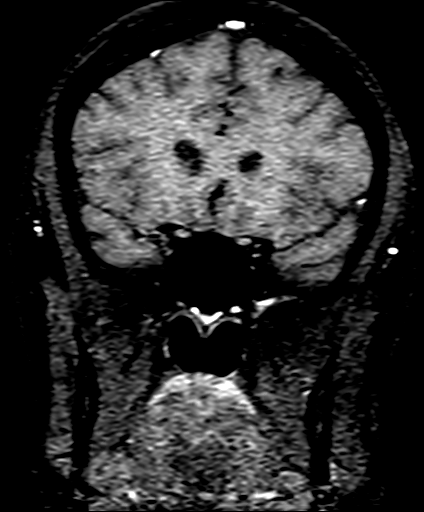

[Series 13: post fs cor · coronal · 3.0mm · 0.35mm/px · 1 of 9 slices shown]
[im 1/9]
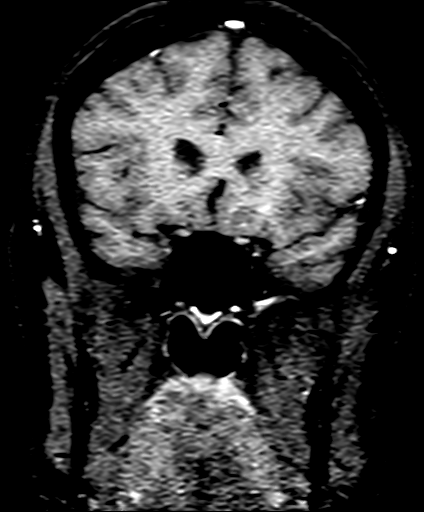

[Series 19: T1 post-contrast · sagittal · 3.0mm · 0.33mm/px · 1 of 11 slices shown (1 of 2)]
[im 1/11]
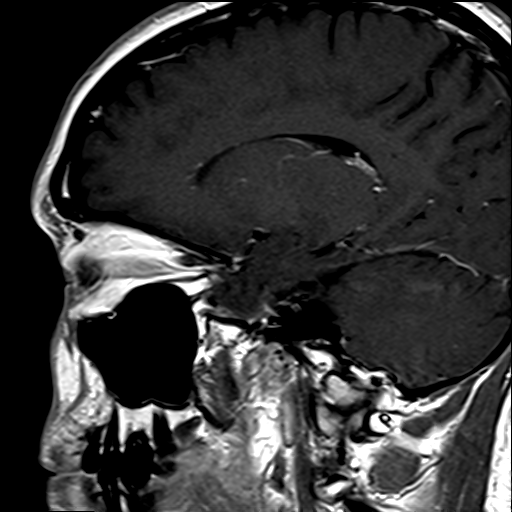

[Series 20: T1 post-contrast · coronal · 3.0mm · 0.33mm/px · 1 of 11 slices shown (2 of 2)]
[im 1/11]
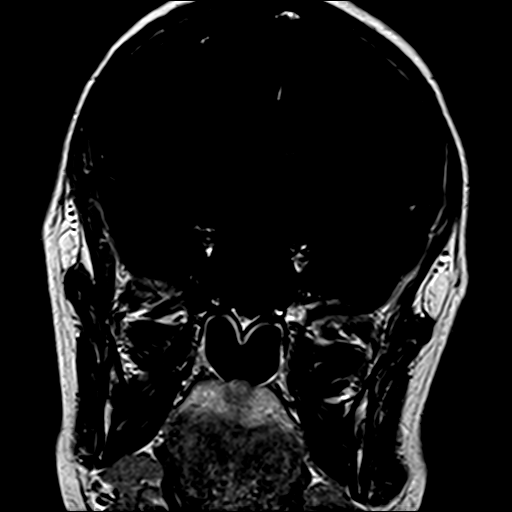

[35 of 48 positions shown; findings below may reference images not displayed]

FINDINGS: Brain: No acute infarction, hemorrhage, hydrocephalus, extra-axial
collection or mass lesion outside the pituitary. No pathologic
enhancement outside of the pituitary.

Dedicated pituitary protocol was performed. Similar size and
appearance of an 8 x 6 mm hypoenhancing lesion in the left aspect of
the pituitary gland, which is T2 hyperintense. Slight suprasellar
extension. Unremarkable infundibulum, slightly displaced to the
right.

Vascular: Major arterial flow voids are maintained at the skull
base.

Skull and upper cervical spine: Normal marrow signal.

Sinuses/Orbits: Clear sinuses.  No acute orbital findings.

Other: No mastoid effusions.
IMPRESSION: 1. Unchanged size/appearance of an 8 mm left pituitary lesion, which
could represent a cyst or microadenoma.
2. No acute intracranial abnormality.

## 2022-02-19 MED ORDER — GADOBENATE DIMEGLUMINE 529 MG/ML IV SOLN
8.0000 mL | Freq: Once | INTRAVENOUS | Status: AC | PRN
Start: 2022-02-19 — End: 2022-02-19
  Administered 2022-02-19: 8 mL via INTRAVENOUS

## 2022-02-23 DIAGNOSIS — F411 Generalized anxiety disorder: Secondary | ICD-10-CM | POA: Diagnosis not present

## 2022-02-23 DIAGNOSIS — F32A Depression, unspecified: Secondary | ICD-10-CM | POA: Diagnosis not present

## 2022-03-04 DIAGNOSIS — Z79899 Other long term (current) drug therapy: Secondary | ICD-10-CM | POA: Diagnosis not present

## 2022-03-04 DIAGNOSIS — F900 Attention-deficit hyperactivity disorder, predominantly inattentive type: Secondary | ICD-10-CM | POA: Diagnosis not present

## 2022-03-11 ENCOUNTER — Ambulatory Visit: Payer: BC Managed Care – PPO | Admitting: Internal Medicine

## 2022-03-27 DIAGNOSIS — F32A Depression, unspecified: Secondary | ICD-10-CM | POA: Diagnosis not present

## 2022-03-27 DIAGNOSIS — F411 Generalized anxiety disorder: Secondary | ICD-10-CM | POA: Diagnosis not present

## 2022-04-06 ENCOUNTER — Encounter: Payer: Self-pay | Admitting: Internal Medicine

## 2022-04-13 DIAGNOSIS — F411 Generalized anxiety disorder: Secondary | ICD-10-CM | POA: Diagnosis not present

## 2022-04-13 DIAGNOSIS — F32A Depression, unspecified: Secondary | ICD-10-CM | POA: Diagnosis not present

## 2022-04-15 ENCOUNTER — Telehealth: Payer: Self-pay | Admitting: Physician Assistant

## 2022-04-15 NOTE — Telephone Encounter (Signed)
Pt states she is experiencing blood during BM. States is it not in the stool, but coming from the anus.   Pt states this has been happening "on and off" for a few weeks.   Pt states she is pregnant.   Pt referred to PCP Triage Nurse.  Awaiting follow up notes

## 2022-04-15 NOTE — Telephone Encounter (Signed)
Pt cold transferred to PCP office.  Pt states nurse instructed her to be seen within 72 hours.  Pt is scheduled for 06/22.  Awaiting triage notes.

## 2022-04-15 NOTE — Telephone Encounter (Signed)
See Triage note, pt is scheduled for tomorrow at 11:20.

## 2022-04-15 NOTE — Telephone Encounter (Signed)
See PCP within 3 days Pt is scheduled for 06/22  Patient Name: Monica Jacobs Gender: Female DOB: 1991/06/08 Age: 31 Y 2 M 14 D Return Phone Number: [phone] (Primary) Address: City/ State/ Zip: [place]  [address] Client [facility] Client Site [facility] Provider Morene Rankins, [place]- [place] Contact Type Call Who Is Calling Patient / Member / Family / Caregiver Call Type Triage / Clinical Relationship To Patient Self Return Phone Number [phone] (Primary) Chief Complaint Rectal Bleeding Reason for Call Symptomatic / Request for Health Information Initial Comment Caller was transferred from the office, patient has blood from the anus, [redacted] weeks pregnant. Translation No Nurse Assessment Nurse: Ronnald Ramp, RN, Miranda Date/Time (Eastern Time): 04/15/2022 9:45:29 AM Confirm and document reason for call. If symptomatic, describe symptoms. ---Caller states she is pregnant and having bleeding from her rectum off and on for the last few months. It happens when she passes stool but her stool is not hard. It is enough to stain the water pink and is also on the toilet paper. Does the patient have any new or worsening symptoms? ---Yes Will a triage be completed? ---Yes Related visit to physician within the last 2 weeks? ---No Does the PT have any chronic conditions? (i.e. diabetes, asthma, this includes High risk factors for pregnancy, etc.) ---Yes List chronic conditions. ---Prolactinoma in pituitary gland, ADD Is the patient pregnant or possibly pregnant? (Ask all females between the ages of 73-55) ---Yes How many weeks gestation? ---6 weeks What is the estimated delivery date? ---0001-01-01 Total number of pregnancies including current? ---2 Number of live births? ---1 Have you felt decreased fetal movement? ---Early Pregnancy - No Fetal Movement Felt Yet Is this a behavioral health or substance abuse  call? ---No  Guidelines Guideline Title Affirmed Question Affirmed Notes Nurse Date/Time Eilene Ghazi Time) Rectal Bleeding MILD rectal bleeding (more than just a few drops or streaks) Ronnald Ramp, RN, Miranda 04/15/2022 9:47:34 AM Disp. Time Eilene Ghazi Time) Disposition Final User 04/15/2022 9:55:02 AM SEE PCP WITHIN 3 DAYS Yes Ronnald Ramp, RN, Miranda Caller Disagree/Comply Comply Caller Understands Yes PreDisposition Call Doctor Care Advice Given Per Guideline SEE PCP WITHIN 3 DAYS: * You need to be seen within 2 or 3 days. * Add 1/4 cup (80 g) of table salt or baking soda to a tub of warm water. Stir the water until it dissolves. * Fill the tub with warm water until it is 3 to 4 inches (7 to 10 cm) deep. * Here is how you can make a saline sitz bath. WARM SALINE SITZ BATH - HOW TO MAKE A SITZ BATH: * Afterwards, pat area dry with unscented toilet paper. * This will decrease swelling and irritation, keep the area clean, and help with healing. * Sit in a warm sitz bath for 20 minutes twice a day. WARM SALINE SITZ BATHS - FOR RECTAL SYMPTOMS: TO SOFTEN STOOLS AND TREAT CONSTIPATION: * Eat a high fiber diet. * Drink adequate liquids (6-8 glasses of water a day) CALL BACK IF: * Dizziness occurs * Bleeding increases CARE ADVICE given per Rectal Bleeding (Adult) guideline. * You become worse Referrals REFERRED TO PCP OFFICE

## 2022-04-16 ENCOUNTER — Encounter: Payer: Self-pay | Admitting: Physician Assistant

## 2022-04-16 ENCOUNTER — Ambulatory Visit: Payer: BC Managed Care – PPO | Admitting: Physician Assistant

## 2022-04-16 VITALS — BP 120/70 | HR 79 | Temp 97.4°F | Ht 63.0 in | Wt 179.2 lb

## 2022-04-16 DIAGNOSIS — Z3A01 Less than 8 weeks gestation of pregnancy: Secondary | ICD-10-CM

## 2022-04-16 DIAGNOSIS — K625 Hemorrhage of anus and rectum: Secondary | ICD-10-CM

## 2022-04-16 NOTE — Patient Instructions (Signed)
It was great to see you!  Referral to GI today -- keep [place] posted on your symptoms in the meantime.  Congrats on your pregnancy!  Take care,  Inda Coke PA-C

## 2022-04-20 ENCOUNTER — Encounter: Payer: Self-pay | Admitting: Internal Medicine

## 2022-04-22 ENCOUNTER — Ambulatory Visit: Payer: BC Managed Care – PPO | Admitting: Physician Assistant

## 2022-04-27 DIAGNOSIS — N911 Secondary amenorrhea: Secondary | ICD-10-CM | POA: Diagnosis not present

## 2022-05-04 DIAGNOSIS — F411 Generalized anxiety disorder: Secondary | ICD-10-CM | POA: Diagnosis not present

## 2022-05-04 DIAGNOSIS — F32A Depression, unspecified: Secondary | ICD-10-CM | POA: Diagnosis not present

## 2022-05-05 ENCOUNTER — Ambulatory Visit: Payer: BC Managed Care – PPO | Admitting: Internal Medicine

## 2022-05-05 DIAGNOSIS — Z3685 Encounter for antenatal screening for Streptococcus B: Secondary | ICD-10-CM | POA: Diagnosis not present

## 2022-05-05 DIAGNOSIS — Z31430 Encounter of female for testing for genetic disease carrier status for procreative management: Secondary | ICD-10-CM | POA: Diagnosis not present

## 2022-05-05 DIAGNOSIS — Z3481 Encounter for supervision of other normal pregnancy, first trimester: Secondary | ICD-10-CM | POA: Diagnosis not present

## 2022-05-05 LAB — OB RESULTS CONSOLE RPR: RPR: NONREACTIVE

## 2022-05-05 LAB — OB RESULTS CONSOLE HEPATITIS B SURFACE ANTIGEN: Hepatitis B Surface Ag: NEGATIVE

## 2022-05-05 LAB — HEPATITIS C ANTIBODY: HCV Ab: NEGATIVE

## 2022-05-05 LAB — OB RESULTS CONSOLE ABO/RH: RH Type: NEGATIVE

## 2022-05-05 LAB — OB RESULTS CONSOLE ANTIBODY SCREEN: Antibody Screen: NEGATIVE

## 2022-05-05 LAB — OB RESULTS CONSOLE RUBELLA ANTIBODY, IGM: Rubella: IMMUNE

## 2022-05-05 LAB — OB RESULTS CONSOLE HIV ANTIBODY (ROUTINE TESTING): HIV: NONREACTIVE

## 2022-05-12 DIAGNOSIS — Z113 Encounter for screening for infections with a predominantly sexual mode of transmission: Secondary | ICD-10-CM | POA: Diagnosis not present

## 2022-05-12 DIAGNOSIS — Z3481 Encounter for supervision of other normal pregnancy, first trimester: Secondary | ICD-10-CM | POA: Diagnosis not present

## 2022-05-12 DIAGNOSIS — Z3400 Encounter for supervision of normal first pregnancy, unspecified trimester: Secondary | ICD-10-CM | POA: Diagnosis not present

## 2022-05-12 DIAGNOSIS — Z3A10 10 weeks gestation of pregnancy: Secondary | ICD-10-CM | POA: Diagnosis not present

## 2022-05-12 LAB — OB RESULTS CONSOLE GC/CHLAMYDIA
Chlamydia: NEGATIVE
Neisseria Gonorrhea: NEGATIVE

## 2022-05-22 DIAGNOSIS — F32A Depression, unspecified: Secondary | ICD-10-CM | POA: Diagnosis not present

## 2022-05-22 DIAGNOSIS — F411 Generalized anxiety disorder: Secondary | ICD-10-CM | POA: Diagnosis not present

## 2022-05-25 DIAGNOSIS — Z3682 Encounter for antenatal screening for nuchal translucency: Secondary | ICD-10-CM | POA: Diagnosis not present

## 2022-05-25 DIAGNOSIS — Z3A12 12 weeks gestation of pregnancy: Secondary | ICD-10-CM | POA: Diagnosis not present

## 2022-05-27 ENCOUNTER — Ambulatory Visit: Payer: BC Managed Care – PPO | Admitting: Internal Medicine

## 2022-06-09 ENCOUNTER — Ambulatory Visit: Payer: BC Managed Care – PPO | Admitting: Internal Medicine

## 2022-06-09 ENCOUNTER — Encounter: Payer: Self-pay | Admitting: Internal Medicine

## 2022-06-09 VITALS — BP 120/78 | HR 78 | Ht 63.0 in | Wt 182.4 lb

## 2022-06-09 DIAGNOSIS — D352 Benign neoplasm of pituitary gland: Secondary | ICD-10-CM | POA: Diagnosis not present

## 2022-06-09 DIAGNOSIS — F411 Generalized anxiety disorder: Secondary | ICD-10-CM | POA: Diagnosis not present

## 2022-06-09 DIAGNOSIS — E221 Hyperprolactinemia: Secondary | ICD-10-CM | POA: Diagnosis not present

## 2022-06-09 DIAGNOSIS — F32A Depression, unspecified: Secondary | ICD-10-CM | POA: Diagnosis not present

## 2022-06-09 NOTE — Progress Notes (Unsigned)
Name: Monica Jacobs  MRN/ DOB: [id number], 03/25/1991    Age/ Sex: 31 y.o., female     PCP: Inda Coke, PA   Reason for Endocrinology Evaluation: Pituitary microadenoma      Initial Endocrinology Clinic Visit: 02/12/2021    PATIENT IDENTIFIER: Monica Jacobs is a 31 y.o., female with a past medical history of .Pituitary microadenoma  She has followed with [facility] since 02/12/21 for consultative assistance with management of her Pituitary microadenoma .      HISTORICAL SUMMARY:   During evaluation for oligomenorrhea  In 10/2020 she was noted to have elevated prolactin 86.1 ng/mL which prompted a brain MRI revealing a pituitary microadenoma of 8 mm .     She is S/P C-section 07/27/2019 , she pumped for ~ 6 months post delivery , but continues to have nipple discharge until 11/2020  Her prolactin level was slightly elevated at our clinic at 33.3 NG/mL, she was started on cabergoline in 01/2021   Prior to pregnancy she had regular periods but since delivery she noted irregular periods. Menarche at age 31   No FH of pituitary disease    On her 08/2021 visit the patient ran out of cabergoline, and we opted to remain off since her prolactin level was within normal range.  She became pregnant by 03/2022 and we stopped cabergoline   SUBJECTIVE:   Today (06/09/2022):  Monica Jacobs is here for a follow-up on pituitary microadenoma, she is currently pregnant at 14.5 weeks of gestation   She has been off cabergoline for ~ 2 months.  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 She has never used smokeless tobacco. She reports current alcohol use. She reports that she does not use drugs. Family History:  Family History  Problem Relation Age of Onset   Anxiety disorder Mother    Hypercholesterolemia Mother    ADD / ADHD Mother    Cancer Father        skin   Heart attack Maternal Grandmother    Mental illness Sister    Diabetes Maternal Grandfather    Heart disease Maternal Grandfather    Colon cancer Neg Hx    Breast cancer Neg Hx    Prostate cancer Neg Hx      HOME MEDICATIONS: Allergies as of 06/09/2022   No Known Allergies      Medication List        Accurate as of June 09, 2022  8:38 AM. If you have any questions, ask your nurse or doctor.          methylphenidate 27 MG CR tablet Commonly known as: CONCERTA Take 27 mg by mouth daily.   multivitamin-prenatal 27-0.8 MG Tabs tablet Take 1 tablet by mouth daily at 12 noon.  OBJECTIVE:   PHYSICAL EXAM: VS: BP 120/78 (BP Location: Left Arm, Patient Position: Sitting, Cuff Size: Small)   Pulse 78   Ht '5\' 3"'$  (1.6 m)   Wt 182 lb 6.4 oz (82.7 kg)   LMP 03/01/2022 Comment: 6 weeks and 4 days  SpO2 96%   BMI 32.31 kg/m    EXAM: General: Pt appears well and is in NAD  Neck: General: Supple without adenopathy. Thyroid: Thyroid size normal.  No goiter or nodules appreciated.   Lungs: Clear with good BS bilat with no rales, rhonchi, or wheezes  Heart: Auscultation: RRR.  Abdomen: Normoactive bowel sounds, soft, nontender, without masses or organomegaly palpable  Extremities:  BL LE: No pretibial edema normal ROM and strength.  Mental Status: Judgment, insight: Intact Mood and affect: No depression, anxiety, or agitation      DATA REVIEWED:   Latest Reference Range & Units 01/22/22 08:48  LH mIU/mL 19.13  FSH mIU/ML 4.4  Prolactin ng/mL 37.9 (H)      Latest Reference Range & Units 09/10/21 14:09  Sodium 135 - 145 mEq/L 140  Potassium 3.5 - 5.1 mEq/L 3.8  Chloride 96 - 112 mEq/L 105  CO2 19 - 32 mEq/L 25  Glucose 70 - 99 mg/dL 104 (H)  BUN 6 - 23 mg/dL 12  Creatinine 0.40 - 1.20 mg/dL 0.85  Calcium 8.4 - 10.5 mg/dL 9.1  GFR >60.00 mL/min 90.53    Latest Reference Range & Units 09/10/21 14:09  Prolactin ng/mL 25.7  Glucose 70 - 99 mg/dL 104 (H)  TSH 0.35 - 5.50 uIU/mL 0.71  T4,Free(Direct) 0.60 - 1.60 ng/dL 0.52 (L)    Brain MRI 12/24/2020     There is a 7 x 8 x 6 mm T2 hyperintense (series 5, image 7) and hypoenhancing lesion in the posterior left pituitary gland. The lesion appears to have some enhancement along its periphery, which doesn't change substantially with dynamic contrast. The central portion lesion is largely hypoenhancing on dynamic contrast. Resulting slight upward convexity of the left pituitary. Infundibulum is within normal limits and essentially midline.      Approximately 8 mm hypoenhancing lesion in the posterior left pituitary. Differential considerations include a pituitary microadenoma (given provided clinical history and suggestion of some enhancement along the periphery of the lesion) versus a pituitary cyst (given T2 hyperintensity and little enhancement centrally with dynamic contrast).      ASSESSMENT / PLAN / RECOMMENDATIONS:   Pituitary Microadenoma:   - No local symptoms - Will repeat MRI in 12/2021 - Pituitary hormones, have all come back normal except prolactin including estradiol, FSH, TFTs, and cortisol -  Update visual field normal 02/2021  -     2. Prolactinoma :     -She has been off cabergoline for ~ 2 months  - No need to check during pregnancy        Follow-up in 4 months  Signed electronically by: Mack Guise, MD  [facility]  [address] [place], [place] [address] Phone: [phone] FAX: [phone]      CC: Inda Coke, [place] [address] Phone: [phone]  Fax: [phone]   Return to Endocrinology clinic as below: Future Appointments  Date Time Provider [place]  08/14/2022  9:00 AM Bo Merino, MD [facility] None

## 2022-06-23 DIAGNOSIS — F32A Depression, unspecified: Secondary | ICD-10-CM | POA: Diagnosis not present

## 2022-06-23 DIAGNOSIS — F411 Generalized anxiety disorder: Secondary | ICD-10-CM | POA: Diagnosis not present

## 2022-07-02 DIAGNOSIS — F411 Generalized anxiety disorder: Secondary | ICD-10-CM | POA: Diagnosis not present

## 2022-07-02 DIAGNOSIS — F32A Depression, unspecified: Secondary | ICD-10-CM | POA: Diagnosis not present

## 2022-07-07 ENCOUNTER — Encounter: Payer: Self-pay | Admitting: Physician Assistant

## 2022-07-09 DIAGNOSIS — Z363 Encounter for antenatal screening for malformations: Secondary | ICD-10-CM | POA: Diagnosis not present

## 2022-07-09 DIAGNOSIS — Z3A19 19 weeks gestation of pregnancy: Secondary | ICD-10-CM | POA: Diagnosis not present

## 2022-07-20 ENCOUNTER — Encounter: Payer: Self-pay | Admitting: *Deleted

## 2022-07-20 DIAGNOSIS — F411 Generalized anxiety disorder: Secondary | ICD-10-CM | POA: Diagnosis not present

## 2022-07-31 NOTE — Progress Notes (Deleted)
Office Visit Note  Patient: Monica Jacobs             Date of Birth: [birth year]           MRN: [id number]             PCP: Inda Coke, PA Referring: Inda Coke, [place] Visit Date: 08/14/2022 Occupation: @GUAROCC @  Subjective:  No chief complaint on file.   History of Present Illness: Monica Jacobs is a 31 y.o. female ***   Activities of Daily Living:  Patient reports morning stiffness for *** {minute/hour:19697}.   Patient {ACTIONS;DENIES/REPORTS:21021675::"Denies"} nocturnal pain.  Difficulty dressing/grooming: {ACTIONS;DENIES/REPORTS:21021675::"Denies"} Difficulty climbing stairs: {ACTIONS;DENIES/REPORTS:21021675::"Denies"} Difficulty getting out of chair: {ACTIONS;DENIES/REPORTS:21021675::"Denies"} Difficulty using hands for taps, buttons, cutlery, and/or writing: {ACTIONS;DENIES/REPORTS:21021675::"Denies"}  No Rheumatology ROS completed.   PMFS History:  Patient Active Problem List   Diagnosis Date Noted   Attention deficit hyperactivity disorder 12/12/2021   Pituitary microadenoma ([facility]) 02/12/2021   Hyperprolactinemia ([facility]) 02/12/2021    Past Medical History:  Diagnosis Date   Anorexia    childhood -- as Programme researcher, broadcasting/film/video   Depression    Environmental allergies     Family History  Problem Relation Age of Onset   Anxiety disorder Mother    Hypercholesterolemia Mother    ADD / ADHD Mother    Cancer Father        skin   Heart attack Maternal Grandmother    Mental illness Sister    Diabetes Maternal Grandfather    Heart disease Maternal Grandfather    Colon cancer Neg Hx    Breast cancer Neg Hx    Prostate cancer Neg Hx    Past Surgical History:  Procedure Laterality Date   CESAREAN SECTION N/A 07/27/2019   Procedure: CESAREAN SECTION;  Surgeon: Linda Hedges, DO;  Location: [facility];  Service: Obstetrics;  Laterality: N/A;  Primary edc 08/03/19 NKDA Tracey RNFA   NO PAST SURGERIES     Social History   Social History Narrative   [place] -- 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   Married    Had first baby in 2020   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, hiking   Immunization History  Administered Date(s) Administered   DTaP 04/01/1989, 06/02/1989, 08/04/1989, 12/14/1990, 06/12/1994   HPV Quadrivalent 06/20/2007, 11/16/2008   Hepatitis A 07/14/2006, 06/20/2007   Hepatitis B 03/24/2000, 07/07/2000, 08/17/2002   IPV 06/02/1989, 08/04/1989, 12/14/1990, 06/12/1994   Influenza Inj Mdck Quad Pf 07/08/2021   Influenza,inj,Quad PF,6+ Mos 07/29/2019   MMR 12/04/1990, 06/12/1994   Meningococcal Conjugate 07/14/2006   PFIZER(Purple Top)SARS-COV-2 Vaccination 04/04/2020, 04/25/2020, 01/21/2021   Td 06/18/1999   Tdap 03/02/2017, 05/24/2019     Objective: Vital Signs: LMP 03/01/2022 Comment: 6 weeks and 4 days   Physical Exam   Musculoskeletal Exam: ***  CDAI Exam: CDAI Score: -- Patient Global: --; Provider Global: -- Swollen: --; Tender: -- Joint Exam 08/14/2022   No joint exam has been documented for this visit   There is currently no information documented on the homunculus. Go to the Rheumatology activity and complete the homunculus joint exam.  Investigation: No additional findings.  Imaging: No results found.  Recent Labs: Lab Results  Component Value Date   WBC 7.5 07/10/2021   HGB 12.8 07/10/2021   PLT 198.0 07/10/2021   NA 139 02/03/2022   K 4.3 02/03/2022   CL 105 02/03/2022   CO2 25 02/03/2022   GLUCOSE 80 02/03/2022   BUN 11 02/03/2022   CREATININE 0.81 02/03/2022  BILITOT 0.4 02/03/2022   ALKPHOS 49 02/03/2022   AST 20 02/03/2022   ALT 18 02/03/2022   PROT 7.2 02/03/2022   ALBUMIN 4.3 02/03/2022   CALCIUM 9.6 02/03/2022    Speciality Comments: No specialty comments available.  Procedures:  No procedures performed Allergies: Patient has no known allergies.   Assessment / Plan:     Visit Diagnoses: Positive ANA (antinuclear antibody) - 02/03/22: ANA 1:320NS, RF<14, Vitamin B12 365, Vitamin D  18.13  Polyarthralgia  Pituitary microadenoma (HCC)  Hyperprolactinemia (HCC)  History of ADHD  Orders: No orders of the defined types were placed in this encounter.  No orders of the defined types were placed in this encounter.   Face-to-face time spent with patient was *** minutes. Greater than 50% of time was spent in counseling and coordination of care.  Follow-Up Instructions: No follow-ups on file.   Ofilia Neas, PA-C  Note - This record has been created using Dragon software.  Chart creation errors have been sought, but may not always  have been located. Such creation errors do not reflect on  the standard of medical care.

## 2022-08-10 DIAGNOSIS — F411 Generalized anxiety disorder: Secondary | ICD-10-CM | POA: Diagnosis not present

## 2022-08-10 DIAGNOSIS — F32A Depression, unspecified: Secondary | ICD-10-CM | POA: Diagnosis not present

## 2022-08-14 ENCOUNTER — Ambulatory Visit: Payer: BC Managed Care – PPO | Admitting: Rheumatology

## 2022-08-14 DIAGNOSIS — D352 Benign neoplasm of pituitary gland: Secondary | ICD-10-CM

## 2022-08-14 DIAGNOSIS — Z8659 Personal history of other mental and behavioral disorders: Secondary | ICD-10-CM

## 2022-08-14 DIAGNOSIS — R768 Other specified abnormal immunological findings in serum: Secondary | ICD-10-CM

## 2022-08-14 DIAGNOSIS — M2550 Pain in unspecified joint: Secondary | ICD-10-CM

## 2022-08-14 DIAGNOSIS — E221 Hyperprolactinemia: Secondary | ICD-10-CM

## 2022-08-24 DIAGNOSIS — M5459 Other low back pain: Secondary | ICD-10-CM | POA: Diagnosis not present

## 2022-08-31 DIAGNOSIS — F32A Depression, unspecified: Secondary | ICD-10-CM | POA: Diagnosis not present

## 2022-08-31 DIAGNOSIS — F411 Generalized anxiety disorder: Secondary | ICD-10-CM | POA: Diagnosis not present

## 2022-09-09 DIAGNOSIS — Z3480 Encounter for supervision of other normal pregnancy, unspecified trimester: Secondary | ICD-10-CM | POA: Diagnosis not present

## 2022-09-09 DIAGNOSIS — Z23 Encounter for immunization: Secondary | ICD-10-CM | POA: Diagnosis not present

## 2022-09-14 DIAGNOSIS — F411 Generalized anxiety disorder: Secondary | ICD-10-CM | POA: Diagnosis not present

## 2022-09-14 DIAGNOSIS — F32A Depression, unspecified: Secondary | ICD-10-CM | POA: Diagnosis not present

## 2022-09-21 DIAGNOSIS — Z3A29 29 weeks gestation of pregnancy: Secondary | ICD-10-CM | POA: Diagnosis not present

## 2022-09-21 DIAGNOSIS — O360120 Maternal care for anti-D [Rh] antibodies, second trimester, not applicable or unspecified: Secondary | ICD-10-CM | POA: Diagnosis not present

## 2022-09-21 DIAGNOSIS — Z23 Encounter for immunization: Secondary | ICD-10-CM | POA: Diagnosis not present

## 2022-10-08 ENCOUNTER — Encounter: Payer: Self-pay | Admitting: *Deleted

## 2022-10-12 DIAGNOSIS — F32A Depression, unspecified: Secondary | ICD-10-CM | POA: Diagnosis not present

## 2022-10-12 DIAGNOSIS — F411 Generalized anxiety disorder: Secondary | ICD-10-CM | POA: Diagnosis not present

## 2022-11-10 DIAGNOSIS — F32A Depression, unspecified: Secondary | ICD-10-CM | POA: Diagnosis not present

## 2022-11-10 DIAGNOSIS — Z3685 Encounter for antenatal screening for Streptococcus B: Secondary | ICD-10-CM | POA: Diagnosis not present

## 2022-11-10 DIAGNOSIS — F411 Generalized anxiety disorder: Secondary | ICD-10-CM | POA: Diagnosis not present

## 2022-11-11 ENCOUNTER — Encounter (HOSPITAL_COMMUNITY): Payer: Self-pay | Admitting: *Deleted

## 2022-11-11 NOTE — Patient Instructions (Addendum)
Monica Jacobs  11/11/2022   Your procedure is scheduled on:  11/26/2022  Arrive at [place] at Entrance C on [address] [facility]  and [address]. You are invited to use the FREE valet parking or use the Visitor's parking deck.  Pick up the phone at the desk and dial [phone].  Call this number if you have problems the morning of surgery: [phone]  Remember:   Do not eat food:(After Midnight) Desps de medianoche.  Do not drink clear liquids: (After Midnight) Desps de medianoche.  Take these medicines the morning of surgery with A SIP OF WATER:  none   Do not wear jewelry, make-up or nail polish.  Do not wear lotions, powders, or perfumes. Do not wear deodorant.  Do not shave 48 hours prior to surgery.  Do not bring valuables to the hospital.  [facility] is not   responsible for any belongings or valuables brought to the hospital.  Contacts, dentures or bridgework may not be worn into surgery.  Leave suitcase in the car. After surgery it may be brought to your room.  For patients admitted to the hospital, checkout time is 11:00 AM the day of              discharge.      Please read over the following fact sheets that you were given:     Preparing for Surgery

## 2022-11-12 ENCOUNTER — Encounter (HOSPITAL_COMMUNITY): Payer: Self-pay

## 2022-11-16 DIAGNOSIS — R609 Edema, unspecified: Secondary | ICD-10-CM | POA: Diagnosis not present

## 2022-11-17 ENCOUNTER — Other Ambulatory Visit (HOSPITAL_COMMUNITY): Payer: BC Managed Care – PPO

## 2022-11-20 NOTE — H&P (Signed)
Monica Jacobs is a 32 y.o. female G2P1001 at 57 weeks presenting for repeat C/S.  Antepartum course uncomplicated.  GBS negative.  Rh negative.  OB History     Gravida  2   Para  1   Term  1   Preterm      AB      Living  1      SAB      IAB      Ectopic      Multiple  0   Live Births  1          Past Medical History:  Diagnosis Date   Anorexia    childhood -- as Programme researcher, broadcasting/film/video   Depression    Environmental allergies    H/O: pituitary tumor    Past Surgical History:  Procedure Laterality Date   CESAREAN SECTION N/A 07/27/2019   Procedure: CESAREAN SECTION;  Surgeon: Linda Hedges, DO;  Location: [facility] LD ORS;  Service: Obstetrics;  Laterality: N/A;  Primary edc 08/03/19 NKDA Tracey RNFA   EYE SURGERY     NO PAST SURGERIES     Family History: family history includes ADD / ADHD in her mother; Anxiety disorder in her mother; Cancer in her father; Diabetes in her maternal grandfather; Heart attack in her maternal grandmother; Heart disease in her maternal grandfather; Hypercholesterolemia in her mother; Mental illness in her sister. Social History:  reports that she has never smoked. She has never used smokeless tobacco. She reports current alcohol use. She reports that she does not use drugs.     Maternal Diabetes: No Genetic Screening: Normal Maternal Ultrasounds/Referrals: Normal Fetal Ultrasounds or other Referrals:  None Maternal Substance Abuse:  No Significant Maternal Medications:  None Significant Maternal Lab Results:  Group B Strep negative and Rh negative Number of Prenatal Visits:greater than 3 verified prenatal visits Other Comments:  None  Review of Systems Maternal Medical History:  Prenatal complications: no prenatal complications Prenatal Complications - Diabetes: none.     Last menstrual period 03/01/2022. Maternal Exam:  Abdomen: Patient reports no abdominal tenderness. Surgical scars: low transverse.   Fundal height is c/w dates.    Estimated fetal weight is 8#.     Physical Exam Constitutional:      Appearance: Normal appearance.  HENT:     Head: Normocephalic and atraumatic.  Pulmonary:     Effort: Pulmonary effort is normal.  Abdominal:     Palpations: Abdomen is soft.  Musculoskeletal:        General: Normal range of motion.     Cervical back: Normal range of motion.  Skin:    General: Skin is warm and dry.  Neurological:     Mental Status: She is alert and oriented to person, place, and time.  Psychiatric:        Mood and Affect: Mood normal.        Behavior: Behavior normal.     Prenatal labs: ABO, Rh: A/Negative/-- (07/11 0000) Antibody: Negative (07/11 0000) Rubella: Immune (07/11 0000) RPR: Nonreactive (07/11 0000)  HBsAg: Negative (07/11 0000)  HIV: Non-reactive (07/11 0000)  GBS:   Negative  Assessment/Plan: 32yo G2P1001 at 39 weeks for repeat C/S Patient is counseled re: risk of bleeding, infection, scarring and damage to surrounding structures.  She is informed of implications in future pregnancies to include abnormal placentation and uterine rupture.  All questions were answered and patient wishes to proceed.   Linda Hedges 11/20/2022, 7:15 AM

## 2022-11-23 DIAGNOSIS — M5489 Other dorsalgia: Secondary | ICD-10-CM | POA: Diagnosis not present

## 2022-11-24 ENCOUNTER — Encounter (HOSPITAL_COMMUNITY)
Admission: RE | Admit: 2022-11-24 | Discharge: 2022-11-24 | Disposition: A | Discharge status: Home / Self Care | Payer: BC Managed Care – PPO | Source: Ambulatory Visit | Attending: Obstetrics & Gynecology | Admitting: Obstetrics & Gynecology

## 2022-11-24 DIAGNOSIS — O34211 Maternal care for low transverse scar from previous cesarean delivery: Secondary | ICD-10-CM | POA: Diagnosis not present

## 2022-11-24 DIAGNOSIS — O26893 Other specified pregnancy related conditions, third trimester: Secondary | ICD-10-CM | POA: Diagnosis not present

## 2022-11-24 DIAGNOSIS — Z01812 Encounter for preprocedural laboratory examination: Secondary | ICD-10-CM | POA: Insufficient documentation

## 2022-11-24 DIAGNOSIS — Z818 Family history of other mental and behavioral disorders: Secondary | ICD-10-CM | POA: Diagnosis not present

## 2022-11-24 DIAGNOSIS — D649 Anemia, unspecified: Secondary | ICD-10-CM | POA: Diagnosis not present

## 2022-11-24 DIAGNOSIS — Z6791 Unspecified blood type, Rh negative: Secondary | ICD-10-CM | POA: Diagnosis not present

## 2022-11-24 DIAGNOSIS — Z23 Encounter for immunization: Secondary | ICD-10-CM | POA: Diagnosis not present

## 2022-11-24 DIAGNOSIS — O34219 Maternal care for unspecified type scar from previous cesarean delivery: Secondary | ICD-10-CM

## 2022-11-24 DIAGNOSIS — O9902 Anemia complicating childbirth: Secondary | ICD-10-CM | POA: Diagnosis not present

## 2022-11-24 DIAGNOSIS — Z3A39 39 weeks gestation of pregnancy: Secondary | ICD-10-CM | POA: Diagnosis not present

## 2022-11-24 HISTORY — DX: Personal history of other specified conditions: Z87.898

## 2022-11-24 LAB — CBC
HCT: 31.6 % — ABNORMAL LOW (ref 36.0–46.0)
Hemoglobin: 10.4 g/dL — ABNORMAL LOW (ref 12.0–15.0)
MCH: 28.7 pg (ref 26.0–34.0)
MCHC: 32.9 g/dL (ref 30.0–36.0)
MCV: 87.3 fL (ref 80.0–100.0)
Platelets: 144 10*3/uL — ABNORMAL LOW (ref 150–400)
RBC: 3.62 MIL/uL — ABNORMAL LOW (ref 3.87–5.11)
RDW: 16.6 % — ABNORMAL HIGH (ref 11.5–15.5)
WBC: 7.5 10*3/uL (ref 4.0–10.5)
nRBC: 0 % (ref 0.0–0.2)

## 2022-11-24 LAB — TYPE AND SCREEN
ABO/RH(D): A NEG
Antibody Screen: POSITIVE

## 2022-11-24 LAB — RPR: RPR Ser Ql: NONREACTIVE

## 2022-11-25 NOTE — Anesthesia Preprocedure Evaluation (Addendum)
Anesthesia Evaluation  Patient identified by MRN, date of birth, ID band Patient awake    Reviewed: Allergy & Precautions, NPO status , Patient's Chart, lab work & pertinent test results  Airway Mallampati: II  TM Distance: >3 FB Neck ROM: Full    Dental no notable dental hx.    Pulmonary neg pulmonary ROS   Pulmonary exam normal breath sounds clear to auscultation       Cardiovascular negative cardio ROS Normal cardiovascular exam Rhythm:Regular Rate:Normal     Neuro/Psych  PSYCHIATRIC DISORDERS  Depression    negative neurological ROS     GI/Hepatic negative GI ROS, Neg liver ROS,,,  Endo/Other  negative endocrine ROS    Renal/GU negative Renal ROS  negative genitourinary   Musculoskeletal negative musculoskeletal ROS (+)    Abdominal   Peds  Hematology  (+) Blood dyscrasia, anemia Hb 10.4, plt 144   Anesthesia Other Findings   Reproductive/Obstetrics (+) Pregnancy 1 prior section 2020 under spinal                             Anesthesia Physical Anesthesia Plan  ASA: 2  Anesthesia Plan: Spinal   Post-op Pain Management: Regional block, Toradol IV (intra-op)* and Ofirmev IV (intra-op)*   Induction:   PONV Risk Score and Plan: 3 and Ondansetron, Dexamethasone and Treatment may vary due to age or medical condition  Airway Management Planned: Natural Airway and Nasal Cannula  Additional Equipment: None  Intra-op Plan:   Post-operative Plan:   Informed Consent: I have reviewed the patients History and Physical, chart, labs and discussed the procedure including the risks, benefits and alternatives for the proposed anesthesia with the patient or authorized representative who has indicated his/her understanding and acceptance.       Plan Discussed with: CRNA  Anesthesia Plan Comments:        Anesthesia Quick Evaluation

## 2022-11-26 ENCOUNTER — Encounter (HOSPITAL_COMMUNITY): Payer: Self-pay | Admitting: Obstetrics & Gynecology

## 2022-11-26 ENCOUNTER — Inpatient Hospital Stay (HOSPITAL_COMMUNITY): Payer: BC Managed Care – PPO | Admitting: Anesthesiology

## 2022-11-26 ENCOUNTER — Encounter (HOSPITAL_COMMUNITY)
Admission: RE | Disposition: A | Discharge status: Home / Self Care | Payer: Self-pay | Source: Home / Self Care | Attending: Obstetrics & Gynecology

## 2022-11-26 ENCOUNTER — Inpatient Hospital Stay (HOSPITAL_COMMUNITY)
Admission: RE | Admit: 2022-11-26 | Discharge: 2022-11-28 | DRG: 788 | Disposition: A | Discharge status: Home / Self Care | Payer: BC Managed Care – PPO | Attending: Obstetrics & Gynecology | Admitting: Obstetrics & Gynecology

## 2022-11-26 ENCOUNTER — Other Ambulatory Visit: Payer: Self-pay

## 2022-11-26 DIAGNOSIS — Z98891 History of uterine scar from previous surgery: Secondary | ICD-10-CM

## 2022-11-26 DIAGNOSIS — O34219 Maternal care for unspecified type scar from previous cesarean delivery: Principal | ICD-10-CM

## 2022-11-26 DIAGNOSIS — Z3A39 39 weeks gestation of pregnancy: Secondary | ICD-10-CM | POA: Diagnosis not present

## 2022-11-26 DIAGNOSIS — O26893 Other specified pregnancy related conditions, third trimester: Secondary | ICD-10-CM | POA: Diagnosis present

## 2022-11-26 DIAGNOSIS — O34211 Maternal care for low transverse scar from previous cesarean delivery: Principal | ICD-10-CM | POA: Diagnosis present

## 2022-11-26 DIAGNOSIS — Z6791 Unspecified blood type, Rh negative: Secondary | ICD-10-CM | POA: Diagnosis not present

## 2022-11-26 SURGERY — Surgical Case
Anesthesia: Spinal

## 2022-11-26 MED ORDER — POVIDONE-IODINE 10 % EX SWAB: 2.0000 | Freq: Once | CUTANEOUS | Status: DC

## 2022-11-26 MED ORDER — CEFAZOLIN SODIUM-DEXTROSE 2-4 GM/100ML-% IV SOLN
INTRAVENOUS | Status: AC
  Filled 2022-11-26: qty 100

## 2022-11-26 MED ORDER — SIMETHICONE 80 MG PO CHEW
80.0000 mg | CHEWABLE_TABLET | ORAL | Status: DC | PRN
  Administered 2022-11-27: 80 mg via ORAL
  Filled 2022-11-26: qty 1

## 2022-11-26 MED ORDER — DEXMEDETOMIDINE HCL IN NACL 80 MCG/20ML IV SOLN
INTRAVENOUS | Status: DC | PRN
  Administered 2022-11-26: 4 ug via INTRAVENOUS
  Administered 2022-11-26 (×2): 8 ug via INTRAVENOUS

## 2022-11-26 MED ORDER — ZOLPIDEM TARTRATE 5 MG PO TABS: 5.0000 mg | ORAL_TABLET | Freq: Every evening | ORAL | Status: DC | PRN

## 2022-11-26 MED ORDER — NALOXONE HCL 4 MG/10ML IJ SOLN: 1.0000 ug/kg/h | INTRAVENOUS | Status: DC | PRN

## 2022-11-26 MED ORDER — OXYTOCIN-SODIUM CHLORIDE 30-0.9 UT/500ML-% IV SOLN
2.5000 [IU]/h | INTRAVENOUS | Status: AC
  Administered 2022-11-26: 2.5 [IU]/h via INTRAVENOUS
  Filled 2022-11-26: qty 500

## 2022-11-26 MED ORDER — KETOROLAC TROMETHAMINE 30 MG/ML IJ SOLN: 30.0000 mg | Freq: Once | INTRAMUSCULAR | Status: DC | PRN

## 2022-11-26 MED ORDER — MEPERIDINE HCL 25 MG/ML IJ SOLN: 6.2500 mg | INTRAMUSCULAR | Status: DC | PRN

## 2022-11-26 MED ORDER — DIPHENHYDRAMINE HCL 25 MG PO CAPS: 25.0000 mg | ORAL_CAPSULE | Freq: Four times a day (QID) | ORAL | Status: DC | PRN

## 2022-11-26 MED ORDER — CHLOROPROCAINE HCL (PF) 3 % IJ SOLN
INTRAMUSCULAR | Status: DC | PRN
  Administered 2022-11-26: 20 mL

## 2022-11-26 MED ORDER — HYDROMORPHONE HCL 1 MG/ML IJ SOLN: 0.2500 mg | INTRAMUSCULAR | Status: DC | PRN

## 2022-11-26 MED ORDER — CEFAZOLIN SODIUM-DEXTROSE 2-4 GM/100ML-% IV SOLN
2.0000 g | INTRAVENOUS | Status: AC
  Administered 2022-11-26: 2 g via INTRAVENOUS

## 2022-11-26 MED ORDER — ONDANSETRON HCL 4 MG/2ML IJ SOLN
INTRAMUSCULAR | Status: AC
  Filled 2022-11-26: qty 2

## 2022-11-26 MED ORDER — OXYCODONE HCL 5 MG PO TABS: 5.0000 mg | ORAL_TABLET | ORAL | Status: DC | PRN

## 2022-11-26 MED ORDER — DIPHENHYDRAMINE HCL 25 MG PO CAPS: 25.0000 mg | ORAL_CAPSULE | ORAL | Status: DC | PRN

## 2022-11-26 MED ORDER — FERROUS SULFATE 325 (65 FE) MG PO TABS
325.0000 mg | ORAL_TABLET | Freq: Every day | ORAL | Status: DC
  Administered 2022-11-27 – 2022-11-28 (×2): 325 mg via ORAL
  Filled 2022-11-26 (×2): qty 1

## 2022-11-26 MED ORDER — WITCH HAZEL-GLYCERIN EX PADS: 1.0000 | MEDICATED_PAD | CUTANEOUS | Status: DC | PRN

## 2022-11-26 MED ORDER — DEXAMETHASONE SODIUM PHOSPHATE 10 MG/ML IJ SOLN
INTRAMUSCULAR | Status: DC | PRN
  Administered 2022-11-26: 8 mg via INTRAVENOUS

## 2022-11-26 MED ORDER — IBUPROFEN 600 MG PO TABS
600.0000 mg | ORAL_TABLET | Freq: Four times a day (QID) | ORAL | Status: DC
  Administered 2022-11-26 – 2022-11-28 (×7): 600 mg via ORAL
  Filled 2022-11-26 (×7): qty 1

## 2022-11-26 MED ORDER — ONDANSETRON HCL 4 MG/2ML IJ SOLN: 4.0000 mg | Freq: Once | INTRAMUSCULAR | Status: DC | PRN

## 2022-11-26 MED ORDER — ACETAMINOPHEN 10 MG/ML IV SOLN
INTRAVENOUS | Status: DC | PRN
  Administered 2022-11-26: 1000 mg via INTRAVENOUS

## 2022-11-26 MED ORDER — OXYTOCIN-SODIUM CHLORIDE 30-0.9 UT/500ML-% IV SOLN
INTRAVENOUS | Status: AC
  Filled 2022-11-26: qty 500

## 2022-11-26 MED ORDER — SCOPOLAMINE 1 MG/3DAYS TD PT72: 1.0000 | MEDICATED_PATCH | Freq: Once | TRANSDERMAL | Status: DC

## 2022-11-26 MED ORDER — SIMETHICONE 80 MG PO CHEW
80.0000 mg | CHEWABLE_TABLET | Freq: Three times a day (TID) | ORAL | Status: DC
  Administered 2022-11-26 – 2022-11-28 (×7): 80 mg via ORAL
  Filled 2022-11-26 (×7): qty 1

## 2022-11-26 MED ORDER — MORPHINE SULFATE (PF) 0.5 MG/ML IJ SOLN
INTRAMUSCULAR | Status: DC | PRN
  Administered 2022-11-26: 150 ug via INTRATHECAL

## 2022-11-26 MED ORDER — COCONUT OIL OIL: 1.0000 | TOPICAL_OIL | Status: DC | PRN

## 2022-11-26 MED ORDER — FENTANYL CITRATE (PF) 100 MCG/2ML IJ SOLN
INTRAMUSCULAR | Status: AC
  Filled 2022-11-26: qty 2

## 2022-11-26 MED ORDER — DIBUCAINE (PERIANAL) 1 % EX OINT: 1.0000 | TOPICAL_OINTMENT | CUTANEOUS | Status: DC | PRN

## 2022-11-26 MED ORDER — PHENYLEPHRINE HCL-NACL 20-0.9 MG/250ML-% IV SOLN
INTRAVENOUS | Status: AC
  Filled 2022-11-26: qty 250

## 2022-11-26 MED ORDER — ACETAMINOPHEN 10 MG/ML IV SOLN
INTRAVENOUS | Status: AC
  Filled 2022-11-26: qty 100

## 2022-11-26 MED ORDER — SODIUM CHLORIDE 0.9 % IR SOLN
Status: DC | PRN
  Administered 2022-11-26: 1

## 2022-11-26 MED ORDER — BUPIVACAINE IN DEXTROSE 0.75-8.25 % IT SOLN
INTRATHECAL | Status: DC | PRN
  Administered 2022-11-26: 1.4 mL via INTRATHECAL

## 2022-11-26 MED ORDER — DIPHENHYDRAMINE HCL 50 MG/ML IJ SOLN: 12.5000 mg | INTRAMUSCULAR | Status: DC | PRN

## 2022-11-26 MED ORDER — PRENATAL MULTIVITAMIN CH
1.0000 | ORAL_TABLET | Freq: Every day | ORAL | Status: DC
  Administered 2022-11-27 – 2022-11-28 (×2): 1 via ORAL
  Filled 2022-11-26 (×2): qty 1

## 2022-11-26 MED ORDER — PHENYLEPHRINE HCL-NACL 20-0.9 MG/250ML-% IV SOLN
INTRAVENOUS | Status: DC | PRN
  Administered 2022-11-26: 60 ug/min via INTRAVENOUS

## 2022-11-26 MED ORDER — AMISULPRIDE (ANTIEMETIC) 5 MG/2ML IV SOLN: 10.0000 mg | Freq: Once | INTRAVENOUS | Status: DC | PRN

## 2022-11-26 MED ORDER — SODIUM CHLORIDE 0.9% FLUSH: 3.0000 mL | INTRAVENOUS | Status: DC | PRN

## 2022-11-26 MED ORDER — SCOPOLAMINE 1 MG/3DAYS TD PT72
MEDICATED_PATCH | TRANSDERMAL | Status: AC
  Filled 2022-11-26: qty 1

## 2022-11-26 MED ORDER — KETOROLAC TROMETHAMINE 30 MG/ML IJ SOLN
INTRAMUSCULAR | Status: DC | PRN
  Administered 2022-11-26: 30 mg via INTRAVENOUS

## 2022-11-26 MED ORDER — ONDANSETRON HCL 4 MG/2ML IJ SOLN: 4.0000 mg | Freq: Three times a day (TID) | INTRAMUSCULAR | Status: DC | PRN

## 2022-11-26 MED ORDER — LACTATED RINGERS IV SOLN: INTRAVENOUS | Status: DC

## 2022-11-26 MED ORDER — MENTHOL 3 MG MT LOZG: 1.0000 | LOZENGE | OROMUCOSAL | Status: DC | PRN

## 2022-11-26 MED ORDER — MORPHINE SULFATE (PF) 0.5 MG/ML IJ SOLN
INTRAMUSCULAR | Status: AC
  Filled 2022-11-26: qty 10

## 2022-11-26 MED ORDER — OXYCODONE HCL 5 MG/5ML PO SOLN: 5.0000 mg | Freq: Once | ORAL | Status: DC | PRN

## 2022-11-26 MED ORDER — KETOROLAC TROMETHAMINE 30 MG/ML IJ SOLN: 30.0000 mg | Freq: Four times a day (QID) | INTRAMUSCULAR | Status: AC | PRN

## 2022-11-26 MED ORDER — NALOXONE HCL 0.4 MG/ML IJ SOLN: 0.4000 mg | INTRAMUSCULAR | Status: DC | PRN

## 2022-11-26 MED ORDER — OXYCODONE HCL 5 MG PO TABS: 5.0000 mg | ORAL_TABLET | Freq: Once | ORAL | Status: DC | PRN

## 2022-11-26 MED ORDER — DEXAMETHASONE SODIUM PHOSPHATE 4 MG/ML IJ SOLN
INTRAMUSCULAR | Status: AC
  Filled 2022-11-26: qty 2

## 2022-11-26 MED ORDER — ACETAMINOPHEN 500 MG PO TABS
1000.0000 mg | ORAL_TABLET | Freq: Four times a day (QID) | ORAL | Status: DC
  Administered 2022-11-26 – 2022-11-28 (×9): 1000 mg via ORAL
  Filled 2022-11-26 (×9): qty 2

## 2022-11-26 MED ORDER — OXYTOCIN-SODIUM CHLORIDE 30-0.9 UT/500ML-% IV SOLN
INTRAVENOUS | Status: DC | PRN
  Administered 2022-11-26: 400 mL via INTRAVENOUS

## 2022-11-26 MED ORDER — PHENYLEPHRINE 80 MCG/ML (10ML) SYRINGE FOR IV PUSH (FOR BLOOD PRESSURE SUPPORT)
PREFILLED_SYRINGE | INTRAVENOUS | Status: AC
  Filled 2022-11-26: qty 10

## 2022-11-26 MED ORDER — ONDANSETRON HCL 4 MG/2ML IJ SOLN
INTRAMUSCULAR | Status: DC | PRN
  Administered 2022-11-26: 4 mg via INTRAVENOUS

## 2022-11-26 MED ORDER — BUPIVACAINE HCL (PF) 0.25 % IJ SOLN
INTRAMUSCULAR | Status: DC | PRN
  Administered 2022-11-26: 30 mL

## 2022-11-26 MED ORDER — CHLOROPROCAINE HCL (PF) 3 % IJ SOLN
INTRAMUSCULAR | Status: AC
  Filled 2022-11-26: qty 20

## 2022-11-26 MED ORDER — KETOROLAC TROMETHAMINE 30 MG/ML IJ SOLN
INTRAMUSCULAR | Status: AC
  Filled 2022-11-26: qty 1

## 2022-11-26 MED ORDER — SCOPOLAMINE 1 MG/3DAYS TD PT72
MEDICATED_PATCH | TRANSDERMAL | Status: DC | PRN
  Administered 2022-11-26: 1 via TRANSDERMAL

## 2022-11-26 MED ORDER — ACETAMINOPHEN 500 MG PO TABS: 1000.0000 mg | ORAL_TABLET | Freq: Four times a day (QID) | ORAL | Status: DC

## 2022-11-26 MED ORDER — SENNOSIDES-DOCUSATE SODIUM 8.6-50 MG PO TABS
2.0000 | ORAL_TABLET | Freq: Every day | ORAL | Status: DC
  Administered 2022-11-27 – 2022-11-28 (×2): 2 via ORAL
  Filled 2022-11-26 (×2): qty 2

## 2022-11-26 MED ORDER — FENTANYL CITRATE (PF) 100 MCG/2ML IJ SOLN
INTRAMUSCULAR | Status: DC | PRN
  Administered 2022-11-26: 15 ug via INTRATHECAL
  Administered 2022-11-26: 35 ug via INTRATHECAL
  Administered 2022-11-26: 50 ug via INTRATHECAL

## 2022-11-26 MED ORDER — METOCLOPRAMIDE HCL 5 MG/ML IJ SOLN
INTRAMUSCULAR | Status: AC
  Filled 2022-11-26: qty 2

## 2022-11-26 MED ORDER — BUPIVACAINE HCL (PF) 0.25 % IJ SOLN
INTRAMUSCULAR | Status: AC
  Filled 2022-11-26: qty 20

## 2022-11-26 MED ORDER — STERILE WATER FOR IRRIGATION IR SOLN
Status: DC | PRN
  Administered 2022-11-26: 1000 mL

## 2022-11-26 SURGICAL SUPPLY — 31 items
BENZOIN TINCTURE PRP APPL 2/3 (GAUZE/BANDAGES/DRESSINGS) ×1 IMPLANT
CHLORAPREP W/TINT 26 (MISCELLANEOUS) ×2 IMPLANT
CLAMP UMBILICAL CORD (MISCELLANEOUS) ×1 IMPLANT
CLOTH BEACON ORANGE TIMEOUT ST (SAFETY) ×1 IMPLANT
DERMABOND ADVANCED .7 DNX12 (GAUZE/BANDAGES/DRESSINGS) IMPLANT
DRSG OPSITE POSTOP 4X10 (GAUZE/BANDAGES/DRESSINGS) ×1 IMPLANT
ELECT REM PT RETURN 9FT ADLT (ELECTROSURGICAL) ×1
ELECTRODE REM PT RTRN 9FT ADLT (ELECTROSURGICAL) ×1 IMPLANT
EXTRACTOR VACUUM KIWI (MISCELLANEOUS) IMPLANT
GAUZE SPONGE 4X4 12PLY STRL LF (GAUZE/BANDAGES/DRESSINGS) IMPLANT
GLOVE BIO SURGEON STRL SZ 6 (GLOVE) ×1 IMPLANT
GLOVE BIOGEL PI IND STRL 6 (GLOVE) ×2 IMPLANT
GLOVE BIOGEL PI IND STRL 7.0 (GLOVE) ×1 IMPLANT
GOWN STRL REUS W/TWL LRG LVL3 (GOWN DISPOSABLE) ×2 IMPLANT
KIT ABG SYR 3ML LUER SLIP (SYRINGE) ×1 IMPLANT
NDL HYPO 25X5/8 SAFETYGLIDE (NEEDLE) ×1 IMPLANT
NEEDLE HYPO 25X5/8 SAFETYGLIDE (NEEDLE) ×1 IMPLANT
NS IRRIG 1000ML POUR BTL (IV SOLUTION) ×1 IMPLANT
PACK C SECTION WH (CUSTOM PROCEDURE TRAY) ×1 IMPLANT
PAD ABD 7.5X8 STRL (GAUZE/BANDAGES/DRESSINGS) IMPLANT
PAD OB MATERNITY 4.3X12.25 (PERSONAL CARE ITEMS) ×1 IMPLANT
STRIP CLOSURE SKIN 1/2X4 (GAUZE/BANDAGES/DRESSINGS) IMPLANT
SUT CHROMIC 0 CTX 36 (SUTURE) ×3 IMPLANT
SUT MON AB 2-0 CT1 27 (SUTURE) ×1 IMPLANT
SUT PDS AB 0 CT1 27 (SUTURE) IMPLANT
SUT PLAIN 0 NONE (SUTURE) IMPLANT
SUT VIC AB 0 CT1 36 (SUTURE) IMPLANT
SUT VIC AB 4-0 KS 27 (SUTURE) IMPLANT
TOWEL OR 17X24 6PK STRL BLUE (TOWEL DISPOSABLE) ×1 IMPLANT
TRAY FOLEY W/BAG SLVR 14FR LF (SET/KITS/TRAYS/PACK) IMPLANT
WATER STERILE IRR 1000ML POUR (IV SOLUTION) ×1 IMPLANT

## 2022-11-26 NOTE — Anesthesia Procedure Notes (Signed)
Spinal  Patient location during procedure: OR Start time: 11/26/2022 7:35 AM End time: 11/26/2022 7:38 AM Reason for block: surgical anesthesia Staffing Performed: anesthesiologist  Anesthesiologist: Pervis Hocking, DO Performed by: Pervis Hocking, DO Authorized by: Pervis Hocking, DO   Preanesthetic Checklist Completed: patient identified, IV checked, risks and benefits discussed, surgical consent, monitors and equipment checked, pre-op evaluation and timeout performed Spinal Block Patient position: sitting Prep: DuraPrep and site prepped and draped Patient monitoring: cardiac monitor, continuous pulse ox and blood pressure Approach: midline Location: L3-4 Injection technique: single-shot Needle Needle type: Pencan  Needle gauge: 24 G Needle length: 9 cm Assessment Sensory level: T6 Events: CSF return Additional Notes Functioning IV was confirmed and monitors were applied. Sterile prep and drape, including hand hygiene and sterile gloves were used. The patient was positioned and the spine was prepped. The skin was anesthetized with lidocaine.  Free flow of clear CSF was obtained prior to injecting local anesthetic into the CSF.  The spinal needle aspirated freely following injection.  The needle was carefully withdrawn.  The patient tolerated the procedure well.

## 2022-11-26 NOTE — Anesthesia Postprocedure Evaluation (Signed)
Anesthesia Post Note  Patient: Monica Jacobs  Procedure(s) Performed: REPEAT CESAREAN SECTION EDC: 12-03-22  ALLERG: NKDA  PREVIOUS X 1     Patient location during evaluation: PACU Anesthesia Type: Spinal Level of consciousness: awake and alert and oriented Pain management: pain level controlled Vital Signs Assessment: post-procedure vital signs reviewed and stable Respiratory status: spontaneous breathing, nonlabored ventilation and respiratory function stable Cardiovascular status: blood pressure returned to baseline and stable Postop Assessment: no headache, no backache, spinal receding, patient able to bend at knees and no apparent nausea or vomiting Anesthetic complications: no   No notable events documented.  Last Vitals:  Vitals:   11/26/22 0945 11/26/22 1000  BP: 132/83 126/66  Pulse: 67 66  Resp: 17 15  Temp:    SpO2: 97% 96%    Last Pain:  Vitals:   11/26/22 0931  TempSrc: Axillary  PainSc: 3    Pain Goal:    LLE Motor Response: Purposeful movement (11/26/22 1000) LLE Sensation: Numbness (11/26/22 1000) RLE Motor Response: Purposeful movement (11/26/22 1000) RLE Sensation: Numbness (11/26/22 1000)     Epidural/Spinal Function Cutaneous sensation: Able to Wiggle Toes (11/26/22 1000), Patient able to flex knees: Yes (11/26/22 1000), Patient able to lift hips off bed: No (11/26/22 1000), Back pain beyond tenderness at insertion site: No (11/26/22 1000), Progressively worsening motor and/or sensory loss: No (11/26/22 1000), Bowel and/or bladder incontinence post epidural: No (11/26/22 1000)  Pervis Hocking

## 2022-11-26 NOTE — Transfer of Care (Signed)
Immediate Anesthesia Transfer of Care Note  Patient: Monica Jacobs  Procedure(s) Performed: REPEAT CESAREAN SECTION EDC: 12-03-22  ALLERG: NKDA  PREVIOUS X 1  Patient Location: PACU  Anesthesia Type:Spinal  Level of Consciousness: awake, alert , and oriented  Airway & Oxygen Therapy: Patient Spontanous Breathing  Post-op Assessment: Report given to RN and Post -op Vital signs reviewed and stable  Post vital signs: Reviewed and stable  Last Vitals:  Vitals Value Taken Time  BP 128/86 11/26/22 0931  Temp    Pulse 63 11/26/22 0935  Resp 18 11/26/22 0935  SpO2 98 % 11/26/22 0935  Vitals shown include unvalidated device data.  Last Pain:  Vitals:   11/26/22 0620  TempSrc: Oral  PainSc:          Complications: No notable events documented.

## 2022-11-26 NOTE — Progress Notes (Signed)
Mother reports difficulty with latching due to short shaft nipples. Mother requested nipple shield and curved tip syringe which she reported using with her first child to help with latch. Size 20 nipple shield, curved tip syringe, and formula taken into room at mother's request. Educated on amounts of formula to supplement with based on baby's age and mother plans to put baby to breast first with each feed. DEBP also set up and education provided to mother on use.  Starleen Arms RN

## 2022-11-26 NOTE — Progress Notes (Signed)
No change to H&P.  Monica Merriott, DO 

## 2022-11-26 NOTE — Lactation Note (Addendum)
This note was copied from a baby's chart. Lactation Consultation Note  Patient Name: Boy Ebonie Westerlund [id number] Date: 11/26/2022 Reason for consult: Initial assessment Age:34 hours  P2, Mother states she had difficulty with latching first child so after one month, she exclusively pumped for 6 mos. Assisted with latching in various positions and baby did sustain latch for 10 min then fell asleep.  Used teacup hold to increase depth with latching.  Encouraged mother to prepump with manual pump. Feed on demand with cues.  Goal 8-12+ times per day after first 24 hrs.  Place baby STS if not cueing.  Lactation information sheet given.  Maternal Data Has patient been taught Hand Expression?: Yes Does the patient have breastfeeding experience prior to this delivery?: Yes How long did the patient breastfeed?: pumped for 6 mos  Feeding Mother's Current Feeding Choice: Breast Milk  LATCH Score Latch: Repeated attempts needed to sustain latch, nipple held in mouth throughout feeding, stimulation needed to elicit sucking reflex.  Audible Swallowing: A few with stimulation  Type of Nipple: Everted at rest and after stimulation (short shaft)  Comfort (Breast/Nipple): Soft / non-tender  Hold (Positioning): Assistance needed to correctly position infant at breast and maintain latch.  LATCH Score: 7   Lactation Tools Discussed/Used  Manual pump, shells.  Interventions Interventions: Breast feeding basics reviewed;Assisted with latch;Skin to skin;Hand express;Pre-pump if needed;Hand pump;Education;LC Services brochure  Discharge Pump: Personal  Consult Status Consult Status: Follow-up Date: 11/27/22 Follow-up type: In-patient    Vivianne Master [facility] 11/26/2022, 2:08 PM

## 2022-11-26 NOTE — Op Note (Signed)
Monica Jacobs PROCEDURE DATE: 11/26/2022  PREOPERATIVE DIAGNOSIS: Intrauterine pregnancy at  34w0dweeks gestation, previous C/S x 1  POSTOPERATIVE DIAGNOSIS: The same  PROCEDURE:  Repeat Low Transverse Cesarean Section  SURGEON:  Dr. MLinda Hedges INDICATIONS: OOdessa Nishiis a 32y.o. G2P1001 at 312w0dcheduled for cesarean section secondary to desire for repeat.  The risks of cesarean section discussed with the patient included but were not limited to: bleeding which may require transfusion or reoperation; infection which may require antibiotics; injury to bowel, bladder, ureters or other surrounding organs; injury to the fetus; need for additional procedures including hysterectomy in the event of a life-threatening hemorrhage; placental abnormalities wth subsequent pregnancies, incisional problems, thromboembolic phenomenon and other postoperative/anesthesia complications. The patient concurred with the proposed plan, giving informed written consent for the procedure.    FINDINGS:  Viable female infant in cephalic presentation, APGARs 9,9:  weight pending  Clear and copious amniotic fluid.  Intact placenta, three vessel cord.  Grossly normal uterus, ovaries and fallopian tubes. .   ANESTHESIA:  Spinal ESTIMATED BLOOD LOSS: 315 ml SPECIMENS: Placenta sent to L&D COMPLICATIONS: None immediate  PROCEDURE IN DETAIL:  The patient received intravenous antibiotics and had sequential compression devices applied to her lower extremities while in the preoperative area.  She was then taken to the operating room where spinal anesthesia was administered and was found to be adequate. She was then placed in a dorsal supine position with a leftward tilt, and prepped and draped in a sterile manner.  A foley catheter was placed into her bladder and attached to constant gravity.  After an adequate timeout was performed, a Pfannenstiel skin incision was made with scalpel and carried through to the underlying  layer of fascia. The fascia was incised in the midline and this incision was extended bilaterally using the Mayo scissors. Kocher clamps were applied to the superior aspect of the fascial incision and the underlying rectus muscles were dissected off bluntly. A similar process was carried out on the inferior aspect of the facial incision. The rectus muscles were separated in the midline bluntly and the peritoneum was entered bluntly.   A transverse hysterotomy was made with a scalpel and extended bilaterally bluntly. The bladder blade was then removed. The infant was successfully delivered using Kiwi vacuum (2 pop offs), and cord was clamped and cut and infant was handed over to awaiting neonatology team. Uterine massage was then administered and the placenta delivered intact with three-vessel cord. The uterus was cleared of clot and debris.  The hysterotomy was closed with 0 chromic.  A second imbricating suture of 0-chromic was used to reinforce the incision and aid in hemostasis.  The peritoneum and rectus muscles were noted to be hemostatic and were reapproximated using 2-0 monocryl in a running fashion.  The fascia was closed with 0-PDS in a running fashion with good restoration of anatomy.  The subcutaneus tissue was copiously irrigated.  Patient was experiencing pain involving her skin layer.  20cc chloroprocaine was poured into the subcutaneous tissue.  The skin was closed with 4-0 vicryl in a subcuticular fashion.  30 cc 0.25% marcaine was injected into the skin along length of incision.  Pt tolerated the procedure will.  All counts were correct x2.  Pt went to the recovery room in stable condition.

## 2022-11-27 LAB — CBC
HCT: 27.1 % — ABNORMAL LOW (ref 36.0–46.0)
Hemoglobin: 8.6 g/dL — ABNORMAL LOW (ref 12.0–15.0)
MCH: 28.1 pg (ref 26.0–34.0)
MCHC: 31.7 g/dL (ref 30.0–36.0)
MCV: 88.6 fL (ref 80.0–100.0)
Platelets: 115 10*3/uL — ABNORMAL LOW (ref 150–400)
RBC: 3.06 MIL/uL — ABNORMAL LOW (ref 3.87–5.11)
RDW: 16.7 % — ABNORMAL HIGH (ref 11.5–15.5)
WBC: 9.8 10*3/uL (ref 4.0–10.5)
nRBC: 0 % (ref 0.0–0.2)

## 2022-11-27 LAB — BIRTH TISSUE RECOVERY COLLECTION (PLACENTA DONATION)

## 2022-11-27 NOTE — Progress Notes (Addendum)
Subjective: Postpartum Day 1: Cesarean Delivery - scheduled RCS. Patient reports tolerating PO and no problems voiding.    Objective: Vital signs in last 24 hours: Temp:  [97.8 F (36.6 C)-98.1 F (36.7 C)] 97.8 F (36.6 C) (02/02 0242) Pulse Rate:  [52-67] 65 (02/02 0242) Resp:  [12-20] 16 (02/02 0242) BP: (109-132)/(54-86) 109/54 (02/02 0242) SpO2:  [95 %-98 %] 97 % (02/02 0242)  Physical Exam:  General: alert and cooperative Lochia: appropriate Uterine Fundus: firm Incision: healing well, pressure dressing removed DVT Evaluation: No evidence of DVT seen on physical exam.  Recent Labs    11/24/22 0940 11/27/22 0519  HGB 10.4* 8.6*  HCT 31.6* 27.1*    Assessment/Plan: Status post Cesarean section. Doing well postoperatively.  Continue current care. Anticipate d/c tomorrow.  Declines circ.    Tyson Dense, MD 11/27/2022, 7:30 AM

## 2022-11-27 NOTE — Social Work (Addendum)
MOB was referred for history of depression and anorexia.  * Referral screened out by Clinical Social Worker because none of the following criteria appear to apply:  ~ History of anxiety/depression during this pregnancy, or of post-partum depression following prior delivery.  ~ Diagnosis of anxiety and/or depression within last 3 years. Per chart review depression and anorexia noted in MOB chart in 2018. No recent concerns noted.  OR * MOB's symptoms currently being treated with medication and/or therapy.  Please contact the Clinical Social Worker if needs arise, by [facility] request, or if MOB scores greater than 9/yes to question 10 on Edinburgh Postpartum Depression Screen.  Letta Kocher, [place] Social Worker [phone]

## 2022-11-28 MED ORDER — IBUPROFEN 600 MG PO TABS: 600.0000 mg | ORAL_TABLET | Freq: Four times a day (QID) | ORAL | 0 refills | Status: DC | PRN

## 2022-11-28 MED ORDER — FERROUS SULFATE 325 (65 FE) MG PO TBEC: 325.0000 mg | DELAYED_RELEASE_TABLET | Freq: Two times a day (BID) | ORAL | 2 refills | Status: DC

## 2022-11-28 MED ORDER — OXYCODONE HCL 5 MG PO TABS: 5.0000 mg | ORAL_TABLET | ORAL | 0 refills | Status: DC | PRN

## 2022-11-28 MED ORDER — DOCUSATE SODIUM 100 MG PO CAPS: 100.0000 mg | ORAL_CAPSULE | Freq: Two times a day (BID) | ORAL | 2 refills | Status: DC

## 2022-11-28 NOTE — Discharge Summary (Signed)
Postpartum Discharge Summary   Patient Name: Monica Jacobs DOB: 03-13-1991 MRN: [id number]  Date of admission: 11/26/2022 Delivery date:11/26/2022  Delivering provider: Linda Hedges  Date of discharge: 11/28/2022  Admitting diagnosis: Previous cesarean section [Z98.891] S/P cesarean section [Z98.891] Intrauterine pregnancy: [redacted]w[redacted]d    Secondary diagnosis:  Principal Problem:   Previous cesarean section Active Problems:   S/P cesarean section  Additional problems: None    Discharge diagnosis: Term Pregnancy Delivered                                              Post partum procedures: None Augmentation: N/A Complications: None  Hospital course: Sceduled C/S   32y.o. yo G2P2002 at 341w0das admitted to the hospital 11/26/2022 for scheduled cesarean section with the following indication:Elective Repeat.Delivery details are as follows:  Membrane Rupture Time/Date: 8:05 AM ,11/26/2022   Delivery Method:C-Section, Vacuum Assisted  Details of operation can be found in separate operative note.  Patient had a postpartum course complicated bynone.  She is ambulating, tolerating a regular diet, passing flatus, and urinating well. Patient is discharged home in stable condition on  11/28/22        Newborn Data: Birth date:11/26/2022  Birth time:8:08 AM  Gender:Female  Living status:Living  Apgars:9 ,9  Weight:3790 g     Magnesium Sulfate received: No BMZ received: No Rhophylac:N/A MMR:N/A T-DaP:Given prenatally Flu: N/A Transfusion:No  Physical exam  Vitals:   11/27/22 0242 11/27/22 1244 11/27/22 2118 11/28/22 0539  BP: (!) 109/54 127/66 119/64 133/80  Pulse: 65 78 80 71  Resp: '16 16 17 17  '$ Temp: 97.8 F (36.6 C) 98 F (36.7 C) 97.8 F (36.6 C) (!) 97.4 F (36.3 C)  TempSrc: Oral Oral Oral Oral  SpO2: 97% 100% 98% 100%  Weight:      Height:       General: alert, cooperative, and no distress Lochia: appropriate Uterine Fundus: firm Incision: Healing well with no significant  drainage DVT Evaluation: No evidence of DVT seen on physical exam. Labs: Lab Results  Component Value Date   WBC 9.8 11/27/2022   HGB 8.6 (L) 11/27/2022   HCT 27.1 (L) 11/27/2022   MCV 88.6 11/27/2022   PLT 115 (L) 11/27/2022      Latest Ref Rng & Units 02/03/2022    9:01 AM  CMP  Glucose 70 - 99 mg/dL 80   BUN 6 - 23 mg/dL 11   Creatinine 0.40 - 1.20 mg/dL 0.81   Sodium 135 - 145 mEq/L 139   Potassium 3.5 - 5.1 mEq/L 4.3   Chloride 96 - 112 mEq/L 105   CO2 19 - 32 mEq/L 25   Calcium 8.4 - 10.5 mg/dL 9.6   Total Protein 6.0 - 8.3 g/dL 7.2   Total Bilirubin 0.2 - 1.2 mg/dL 0.4   Alkaline Phos 39 - 117 U/L 49   AST 0 - 37 U/L 20   ALT 0 - 35 U/L 18    Edinburgh Score:    11/28/2022    3:57 AM  Edinburgh Postnatal Depression Scale Screening Tool  I have been able to laugh and see the funny side of things. 0  I have looked forward with enjoyment to things. 0  I have blamed myself unnecessarily when things went wrong. 1  I have been anxious or worried for no good reason. 1  I have felt scared or panicky for no good reason. 0  Things have been getting on top of me. 0  I have been so unhappy that I have had difficulty sleeping. 0  I have felt sad or miserable. 0  I have been so unhappy that I have been crying. 0  The thought of harming myself has occurred to me. 0  Edinburgh Postnatal Depression Scale Total 2      After visit meds:  Allergies as of 11/28/2022   No Known Allergies      Medication List     TAKE these medications    acetaminophen 500 MG tablet Commonly known as: TYLENOL Take 500 mg by mouth every 6 (six) hours as needed (pain.).   docusate sodium 100 MG capsule Commonly known as: Colace Take 1 capsule (100 mg total) by mouth 2 (two) times daily.   ferrous sulfate 325 (65 FE) MG EC tablet Take 1 tablet (325 mg total) by mouth 2 (two) times daily.   FIBER PO Take 1 tablet by mouth daily as needed (digestive regularity).   ibuprofen 600 MG  tablet Commonly known as: ADVIL Take 1 tablet (600 mg total) by mouth every 6 (six) hours as needed.   multivitamin-prenatal 27-0.8 MG Tabs tablet Take 1 tablet by mouth in the morning.   oxyCODONE 5 MG immediate release tablet Commonly known as: Oxy IR/ROXICODONE Take 1 tablet (5 mg total) by mouth every 4 (four) hours as needed for severe pain.         Discharge home in stable condition Infant Feeding: Bottle and Breast Infant Disposition:home with mother Discharge instruction: per After Visit Summary and Postpartum booklet. Activity: Advance as tolerated. Pelvic rest for 6 weeks.  Diet: routine diet Anticipated Birth Control: BTL done PP Postpartum Appointment:6 weeks Additional Postpartum F/U:  None Future Appointments: Future Appointments  Date Time Provider [place]  02/25/2023  8:20 AM Bo Merino, MD [facility] None   Follow up Visit:      11/28/2022 Tyson Dense, MD

## 2022-11-30 ENCOUNTER — Telehealth: Payer: Self-pay

## 2022-11-30 NOTE — Patient Outreach (Signed)
  Care Coordination TOC Note Transition Care Management Follow-up Telephone Call Date of discharge and from where: 11/29/22-[place]   Dx: "c-section" How have you been since you were released from the hospital? [place] she is doing well. She is currently at Pediatrician appt. Baby doing well. She voices that her pain is getting better everyday.  Any questions or concerns? Yes-Patient voices she has a skin tear area above c-section incision-possibly from tape being pulled off skin-wanting to know how to treat. Instructed patient on cleaning area and applying band aid if needed. She voiced understanding.  Items Reviewed: Did the pt receive and understand the discharge instructions provided? Yes  Medications obtained and verified?  Patient currently at pediatrician appt-unable to review meds Other? Yes  Any new allergies since your discharge? No  Dietary orders reviewed? Yes Do you have support at home? Yes   Home Care and Equipment/Supplies: Were home health services ordered? not applicable If so, what is the name of the agency? N/A  Has the agency set up a time to come to the patient's home? not applicable Were any new equipment or medical supplies ordered?  No What is the name of the medical supply agency? N/A Were you able to get the supplies/equipment? not applicable Do you have any questions related to the use of the equipment or supplies? No  Functional Questionnaire: (I = Independent and D = Dependent) ADLs: I  Bathing/Dressing- I  Meal Prep- I  Eating- I  Maintaining continence- I  Transferring/Ambulation- I  Managing Meds- I  Follow up appointments reviewed:  PCP Hospital f/u appt confirmed? No   Specialist Hospital f/u appt confirmed? Yes  Scheduled to see OB/GYN in 6wks. Are transportation arrangements needed? No  If their condition worsens, is the pt aware to call PCP or go to the Emergency Dept.? Yes Was the patient provided with contact information  for the PCP's office or ED? Yes Was to pt encouraged to call back with questions or concerns? Yes  SDOH assessments and interventions completed:   Yes SDOH Interventions Today    Flowsheet Row Most Recent Value  SDOH Interventions   Food Insecurity Interventions Intervention Not Indicated  Transportation Interventions Intervention Not Indicated       Care Coordination Interventions:  Education provided on post-partum care    Encounter Outcome:  Pt. Visit Completed     Enzo Montgomery, RN,BSN,CCM [place] [facility] Telephonic Care Management Coordinator Direct Phone: [phone] Toll Free: [phone] Fax: [phone]

## 2022-12-07 ENCOUNTER — Telehealth (HOSPITAL_COMMUNITY): Payer: Self-pay | Admitting: *Deleted

## 2022-12-07 DIAGNOSIS — F32A Depression, unspecified: Secondary | ICD-10-CM | POA: Diagnosis not present

## 2022-12-07 DIAGNOSIS — F411 Generalized anxiety disorder: Secondary | ICD-10-CM | POA: Diagnosis not present

## 2022-12-07 NOTE — Telephone Encounter (Signed)
Patient voiced no questions or concerns regarding her health at this time. EPDS=5. Patient reported having experienced anxiety after the birth of her first child, so she established a relationship with a therapist prior to birth with this infant. RN praised patient for pro-active self-care. Patient voiced no questions or concerns regarding infant at this time. Patient reports infant sleeps in a bassinet on his back. RN reviewed ABCs of safe sleep. Patient verbalized understanding. Patient requested RN email information on hospital's virtual postpartum classes and support groups. Email sent. Erline Levine, RN, 12/07/22, [phone]

## 2022-12-29 DIAGNOSIS — F411 Generalized anxiety disorder: Secondary | ICD-10-CM | POA: Diagnosis not present

## 2022-12-29 DIAGNOSIS — F32A Depression, unspecified: Secondary | ICD-10-CM | POA: Diagnosis not present

## 2023-01-05 DIAGNOSIS — Z1389 Encounter for screening for other disorder: Secondary | ICD-10-CM | POA: Diagnosis not present

## 2023-01-20 DIAGNOSIS — F411 Generalized anxiety disorder: Secondary | ICD-10-CM | POA: Diagnosis not present

## 2023-01-20 DIAGNOSIS — F32A Depression, unspecified: Secondary | ICD-10-CM | POA: Diagnosis not present

## 2023-02-08 DIAGNOSIS — F411 Generalized anxiety disorder: Secondary | ICD-10-CM | POA: Diagnosis not present

## 2023-02-08 DIAGNOSIS — R41840 Attention and concentration deficit: Secondary | ICD-10-CM | POA: Diagnosis not present

## 2023-02-08 DIAGNOSIS — F32A Depression, unspecified: Secondary | ICD-10-CM | POA: Diagnosis not present

## 2023-02-11 NOTE — Progress Notes (Deleted)
Office Visit Note  Patient: Monica Jacobs             Date of Birth: [birth year]           MRN: [id number]             PCP: Jarold Motto, PA Referring: Jarold Motto, PA Visit Date: 02/25/2023 Occupation: @  Subjective:  No chief complaint on file.   History of Present Illness: Monica Jacobs is a 32 y.o. female ***     Activities of Daily Living:  Patient reports morning stiffness for *** {minute/hour:19697}.   Patient {ACTIONS;DENIES/REPORTS:21021675::"Denies"} nocturnal pain.  Difficulty dressing/grooming: {ACTIONS;DENIES/REPORTS:21021675::"Denies"} Difficulty climbing stairs: {ACTIONS;DENIES/REPORTS:21021675::"Denies"} Difficulty getting out of chair: {ACTIONS;DENIES/REPORTS:21021675::"Denies"} Difficulty using hands for taps, buttons, cutlery, and/or writing: {ACTIONS;DENIES/REPORTS:21021675::"Denies"}  No Rheumatology ROS completed.   PMFS History:  Patient Active Problem List   Diagnosis Date Noted   Previous cesarean section 11/26/2022   S/P cesarean section 11/26/2022   Attention deficit hyperactivity disorder 12/12/2021   Pituitary microadenoma 02/12/2021   Hyperprolactinemia 02/12/2021    Past Medical History:  Diagnosis Date   Anorexia    childhood -- as Tree surgeon   Depression    Environmental allergies    H/O: pituitary tumor     Family History  Problem Relation Age of Onset   Anxiety disorder Mother    Hypercholesterolemia Mother    ADD / ADHD Mother    Cancer Father        skin   Heart attack Maternal Grandmother    Mental illness Sister    Diabetes Maternal Grandfather    Heart disease Maternal Grandfather    Colon cancer Neg Hx    Breast cancer Neg Hx    Prostate cancer Neg Hx    Past Surgical History:  Procedure Laterality Date   CESAREAN SECTION N/A 07/27/2019   Procedure: CESAREAN SECTION;  Surgeon: Mitchel Honour, DO;  [facility]: [facility];  Service: Obstetrics;  Laterality: N/A;  Primary edc 08/03/19 NKDA Tracey  RNFA   CESAREAN SECTION N/A 11/26/2022   Procedure: REPEAT CESAREAN SECTION EDC: 12-03-22  ALLERG: NKDA  PREVIOUS X 1;  Surgeon: Mitchel Honour, DO;  Location: [facility];  Service: Obstetrics;  Laterality: N/A;   EYE SURGERY     NO PAST SURGERIES     Social History   Social History Narrative   [employer] -- 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   Married    Had first baby in 2020   Fun: has a Nurse, mental health, hiking   Immunization History  Administered Date(s) Administered   DTaP 04/01/1989, 06/02/1989, 08/04/1989, 12/14/1990, 06/12/1994   HPV Quadrivalent 06/20/2007, 11/16/2008   Hepatitis A 07/14/2006, 06/20/2007   Hepatitis B 03/24/2000, 07/07/2000, 08/17/2002   IPV 06/02/1989, 08/04/1989, 12/14/1990, 06/12/1994   Influenza Inj Mdck Quad Pf 07/08/2021   Influenza,inj,Quad PF,6+ Mos 07/29/2019   MMR 12/04/1990, 06/12/1994   Meningococcal Conjugate 07/14/2006   PFIZER(Purple Top)SARS-COV-2 Vaccination 04/04/2020, 04/25/2020, 01/21/2021   Td 06/18/1999   Tdap 03/02/2017, 05/24/2019     Objective: Vital Signs: There were no vitals taken for this visit.   Physical Exam   Musculoskeletal Exam: ***  CDAI Exam: CDAI Score: -- Patient Global: --; Provider Global: -- Swollen: --; Tender: -- Joint Exam 02/25/2023   No joint exam has been documented for this visit   There is currently no information documented on the homunculus. Go to the Rheumatology activity and complete the homunculus joint exam.  Investigation: No additional findings.  Imaging: No  results found.  Recent Labs: Lab Results  Component Value Date   WBC 9.8 11/27/2022   HGB 8.6 (L) 11/27/2022   PLT 115 (L) 11/27/2022   NA 139 02/03/2022   K 4.3 02/03/2022   CL 105 02/03/2022   CO2 25 02/03/2022   GLUCOSE 80 02/03/2022   BUN 11 02/03/2022   CREATININE 0.81 02/03/2022   BILITOT 0.4 02/03/2022   ALKPHOS 49 02/03/2022   AST 20 02/03/2022   ALT 18 02/03/2022   PROT 7.2 02/03/2022   ALBUMIN 4.3  02/03/2022   CALCIUM 9.6 02/03/2022    Speciality Comments: No specialty comments available.  Procedures:  No procedures performed Allergies: Patient has no known allergies.   Assessment / Plan:     Visit Diagnoses: Polyarthralgia  Positive ANA (antinuclear antibody) - 02/03/22: ANA 1:320NS, RF negative  Pituitary microadenoma  Hyperprolactinemia  S/P cesarean section  History of ADHD  Orders: No orders of the defined types were placed in this encounter.  No orders of the defined types were placed in this encounter.   Face-to-face time spent with patient was *** minutes. Greater than 50% of time was spent in counseling and coordination of care.  Follow-Up Instructions: No follow-ups on file.   Gearldine Bienenstock, PA-C  Note - This record has been created using Dragon software.  Chart creation errors have been sought, but may not always  have been located. Such creation errors do not reflect on  the standard of medical care.

## 2023-02-25 ENCOUNTER — Encounter: Payer: BC Managed Care – PPO | Admitting: Rheumatology

## 2023-02-25 DIAGNOSIS — R768 Other specified abnormal immunological findings in serum: Secondary | ICD-10-CM

## 2023-02-25 DIAGNOSIS — E221 Hyperprolactinemia: Secondary | ICD-10-CM

## 2023-02-25 DIAGNOSIS — D352 Benign neoplasm of pituitary gland: Secondary | ICD-10-CM

## 2023-02-25 DIAGNOSIS — M2550 Pain in unspecified joint: Secondary | ICD-10-CM

## 2023-02-25 DIAGNOSIS — Z8659 Personal history of other mental and behavioral disorders: Secondary | ICD-10-CM

## 2023-02-25 DIAGNOSIS — Z98891 History of uterine scar from previous surgery: Secondary | ICD-10-CM

## 2023-02-26 DIAGNOSIS — F32A Depression, unspecified: Secondary | ICD-10-CM | POA: Diagnosis not present

## 2023-02-26 DIAGNOSIS — F411 Generalized anxiety disorder: Secondary | ICD-10-CM | POA: Diagnosis not present

## 2023-03-19 DIAGNOSIS — F32A Depression, unspecified: Secondary | ICD-10-CM | POA: Diagnosis not present

## 2023-03-19 DIAGNOSIS — R41840 Attention and concentration deficit: Secondary | ICD-10-CM | POA: Diagnosis not present

## 2023-03-19 DIAGNOSIS — F411 Generalized anxiety disorder: Secondary | ICD-10-CM | POA: Diagnosis not present

## 2023-04-16 DIAGNOSIS — R41840 Attention and concentration deficit: Secondary | ICD-10-CM | POA: Diagnosis not present

## 2023-04-16 DIAGNOSIS — F32A Depression, unspecified: Secondary | ICD-10-CM | POA: Diagnosis not present

## 2023-04-16 DIAGNOSIS — F411 Generalized anxiety disorder: Secondary | ICD-10-CM | POA: Diagnosis not present

## 2023-05-14 DIAGNOSIS — F411 Generalized anxiety disorder: Secondary | ICD-10-CM | POA: Diagnosis not present

## 2023-05-14 DIAGNOSIS — F32A Depression, unspecified: Secondary | ICD-10-CM | POA: Diagnosis not present

## 2023-05-14 DIAGNOSIS — R41840 Attention and concentration deficit: Secondary | ICD-10-CM | POA: Diagnosis not present

## 2023-07-29 DIAGNOSIS — Z01419 Encounter for gynecological examination (general) (routine) without abnormal findings: Secondary | ICD-10-CM | POA: Diagnosis not present

## 2023-07-29 DIAGNOSIS — Z6833 Body mass index (BMI) 33.0-33.9, adult: Secondary | ICD-10-CM | POA: Diagnosis not present

## 2023-08-03 ENCOUNTER — Ambulatory Visit (INDEPENDENT_AMBULATORY_CARE_PROVIDER_SITE_OTHER): Payer: BC Managed Care – PPO | Admitting: Physician Assistant

## 2023-08-03 ENCOUNTER — Encounter: Payer: Self-pay | Admitting: Physician Assistant

## 2023-08-03 VITALS — BP 110/70 | HR 73 | Temp 97.8°F | Ht 63.5 in | Wt 188.5 lb

## 2023-08-03 DIAGNOSIS — R0683 Snoring: Secondary | ICD-10-CM | POA: Diagnosis not present

## 2023-08-03 DIAGNOSIS — F909 Attention-deficit hyperactivity disorder, unspecified type: Secondary | ICD-10-CM

## 2023-08-03 DIAGNOSIS — Z23 Encounter for immunization: Secondary | ICD-10-CM

## 2023-08-03 DIAGNOSIS — Z0000 Encounter for general adult medical examination without abnormal findings: Secondary | ICD-10-CM

## 2023-08-03 DIAGNOSIS — D352 Benign neoplasm of pituitary gland: Secondary | ICD-10-CM

## 2023-08-03 DIAGNOSIS — E669 Obesity, unspecified: Secondary | ICD-10-CM | POA: Diagnosis not present

## 2023-08-03 NOTE — Progress Notes (Signed)
Monica Jacobs is a 32 y.o. female and is here for a comprehensive physical exam.  HPI There are no preventive care reminders to display for this patient.   Acute Concerns: Post-Partum: Reports she is 8 months post-partum, recovering well.  States she is currently breastfeeding 3/day and supplementing formula until child is ready to wean. Describes difficulty sleeping through the night; light sleeper with baby  Snoring: Reports her husband has mentioned that she snores. States that her weight pre-pregnancy was in the 160s and she is currently in the 180s.  Denies waking herself up gasping for air.   Chronic Issues: ADHD: Previously managed with Methylphenidate-ER prior to her pregnancies.  Reports side effect of shakiness.  States she is currently managing with caffeine and sugar.  Endorses brain fog during pregnancy, slightly improved post-partum.  Denies issues with her mood.  Health Maintenance: Immunizations -- UpToDate  Colonoscopy -- N/A Mammogram -- N/A PAP -- Last done 12/12/21.Results were normal. Repeat in 2028. Bone Density -- N/A Diet -- Healthy overall: currently breastfeeding so snacking a lot, using lots of sugar and caffeine to manage ADHD.  Exercise -- Regular exercise: working out 3-4/week   Sleep habits -- 8 months post-partum (noted above), otherwise good sleep quality. Mood -- Stable.  UTD with dentist? - Yes. UTD with eye doctor? - No.  Weight history: Wt Readings from Last 10 Encounters:  08/03/23 188 lb 8 oz (85.5 kg)  11/26/22 214 lb 14.4 oz (97.5 kg)  11/12/22 205 lb (93 kg)  06/09/22 182 lb 6.4 oz (82.7 kg)  04/16/22 179 lb 4 oz (81.3 kg)  02/06/22 180 lb (81.6 kg)  02/03/22 179 lb 6.1 oz (81.4 kg)  01/22/22 180 lb (81.6 kg)  09/10/21 176 lb (79.8 kg)  07/10/21 171 lb 4 oz (77.7 kg)   Body mass index is 32.87 kg/m. Patient's last menstrual period was 07/07/2023 (exact date).  Alcohol use:  reports current alcohol use.  Tobacco use:   Tobacco Use: Low Risk  (08/03/2023)   Patient History    Smoking Tobacco Use: Never    Smokeless Tobacco Use: Never    Passive Exposure: Not on file   Eligible for lung cancer screening? no     08/03/2023    1:48 PM  Depression screen PHQ 2/9  Decreased Interest 0  Down, Depressed, Hopeless 0  PHQ - 2 Score 0  Altered sleeping 3  Tired, decreased energy 3  Change in appetite 2  Feeling bad or failure about yourself  0  Moving slowly or fidgety/restless 1  Suicidal thoughts 0  PHQ-9 Score 9  Difficult doing work/chores Somewhat difficult     Other providers/specialists: Patient Care Team: Jarold Motto, [place] as PCP - General (Physician Assistant)   PMHx, SurgHx, SocialHx, Medications, and Allergies were reviewed in the Visit Navigator and updated as appropriate.   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   Depression    Environmental allergies    H/O: pituitary tumor     Past Surgical History:  Procedure Laterality Date   CESAREAN SECTION N/A 07/27/2019   Procedure: CESAREAN SECTION;  Surgeon: Mitchel Honour, DO;  [facility]: [facility];  Service: Obstetrics;  Laterality: N/A;  Primary edc 08/03/19 NKDA Tracey RNFA   CESAREAN SECTION N/A 11/26/2022   Procedure: REPEAT CESAREAN SECTION EDC: 12-03-22  ALLERG: NKDA  PREVIOUS X 1;  Surgeon: Mitchel Honour, DO;  [facility]: [facility];  Service: Obstetrics;  Laterality: N/A;   EYE SURGERY     NO PAST SURGERIES     Family History  Problem Relation Age of Onset   Anxiety disorder Mother    Hypercholesterolemia Mother    ADD / ADHD Mother    Cancer Father        skin   Heart attack Maternal Grandmother    Mental illness Sister    Diabetes Maternal Grandfather    Heart disease Maternal Grandfather    Colon cancer Neg Hx    Breast cancer Neg Hx    Prostate cancer Neg Hx    Social History   Tobacco Use   Smoking status: Never   Smokeless tobacco: Never  Vaping Use   Vaping  status: Never Used  Substance Use Topics   Alcohol use: Yes    Comment: Occasionally   Drug use: No   Review of Systems:   ROS See pertinent positives and negatives as per the HPI.  Objective:   BP 110/70 (BP Location: Left Arm, Patient Position: Sitting, Cuff Size: Large)   Pulse 73   Temp 97.8 F (36.6 C) (Temporal)   Ht 5' 3.5" (1.613 m)   Wt 188 lb 8 oz (85.5 kg)   LMP 07/07/2023 (Exact Date)   SpO2 98%   Breastfeeding Yes   BMI 32.87 kg/m  Body mass index is 32.87 kg/m.   General Appearance:    Alert, cooperative, no distress, appears stated age  Head:    Normocephalic, without obvious abnormality, atraumatic  Eyes:    PERRL, conjunctiva/corneas clear, EOM's intact, fundi    benign, both eyes  Ears:    Normal TM's and external ear canals, both ears  Nose:   Nares normal, septum midline, mucosa normal, no drainage    or sinus tenderness  Throat:   Lips, mucosa, and tongue normal; teeth and gums normal  Neck:   Supple, symmetrical, trachea midline, no adenopathy;    thyroid:  no enlargement/tenderness/nodules; no carotid   bruit or JVD  Back:     Symmetric, no curvature, ROM normal, no CVA tenderness  Lungs:     Clear to auscultation bilaterally, respirations unlabored  Chest Wall:    No tenderness or deformity   Heart:    Regular rate and rhythm, S1 and S2 normal, no murmur, rub or gallop  Breast Exam:    Deferred  Abdomen:     Soft, non-tender, bowel sounds active all four quadrants,    no masses, no organomegaly  Genitalia:    Deferred  Extremities:   Extremities normal, atraumatic, no cyanosis or edema  Pulses:   2+ and symmetric all extremities  Skin:   Skin color, texture, turgor normal, no rashes or lesions  Lymph nodes:   Cervical, supraclavicular, and axillary nodes normal  Neurologic:   CNII-XII intact, normal strength, sensation and reflexes    throughout    Assessment/Plan:   Routine physical examination Today patient counseled on age appropriate  routine health concerns for screening and prevention, each reviewed and up to date or declined. Immunizations reviewed and up to date or declined. Labs ordered and reviewed. Risk factors for depression reviewed and negative. Hearing function and visual acuity are intact. ADLs screened and addressed as needed. Functional ability and level of safety reviewed and appropriate. Education, counseling and referrals performed based on assessed risks today. Patient provided with a copy of personalized plan for preventive services.  Obesity, unspecified class, unspecified obesity type, unspecified whether serious comorbidity present Continue efforts  at healthy lifestyle  Need for immunization against influenza Flu shot provided today  Snoring Low suspicion for obstructive sleep apnea however I recommended that if after breastfeeding and further weight loss, she reach out if having symptom(s) and we can refer her at that time  Attention deficit hyperactivity disorder (ADHD), unspecified ADHD type Management per specialist  Pituitary microadenoma [facility]) Management per specialist; she is planning to follow-up with endo after breastfeeding  I,Emily Lagle,acting as a scribe for [employer], PA.,have documented all relevant documentation on the behalf of Jarold Motto, PA,as directed by  Jarold Motto, PA while in the presence of Jarold Motto, [place].  I, Jarold Motto, [place], have reviewed all documentation for this visit. The documentation on 08/03/23 for the exam, diagnosis, procedures, and orders are all accurate and complete.  Jarold Motto, PA-C [facility]

## 2023-08-03 NOTE — Patient Instructions (Signed)
It was great to see you! ? ?Please go to the lab for blood work.  ? ?Our office will call you with your results unless you have chosen to receive results via MyChart. ? ?If your blood work is normal we will follow-up each year for physicals and as scheduled for chronic medical problems. ? ?If anything is abnormal we will treat accordingly and get you in for a follow-up. ? ?Take care, ? ?Jerrin Recore ?  ? ? ?

## 2023-08-04 LAB — COMPREHENSIVE METABOLIC PANEL
ALT: 23 U/L (ref 0–35)
AST: 18 U/L (ref 0–37)
Albumin: 4.5 g/dL (ref 3.5–5.2)
Alkaline Phosphatase: 66 U/L (ref 39–117)
BUN: 11 mg/dL (ref 6–23)
CO2: 29 meq/L (ref 19–32)
Calcium: 9.4 mg/dL (ref 8.4–10.5)
Chloride: 101 meq/L (ref 96–112)
Creatinine, Ser: 0.79 mg/dL (ref 0.40–1.20)
GFR: 97.54 mL/min (ref >60.00)
Glucose, Bld: 98 mg/dL (ref 70–99)
Potassium: 3.6 meq/L (ref 3.5–5.1)
Sodium: 139 meq/L (ref 135–145)
Total Bilirubin: 0.4 mg/dL (ref 0.2–1.2)
Total Protein: 7.1 g/dL (ref 6.0–8.3)

## 2023-08-04 LAB — CBC WITH DIFFERENTIAL/PLATELET
Basophils Absolute: 0 10*3/uL (ref 0.0–0.1)
Basophils Relative: 0.5 % (ref 0.0–3.0)
Eosinophils Absolute: 0.1 10*3/uL (ref 0.0–0.7)
Eosinophils Relative: 1.1 % (ref 0.0–5.0)
HCT: 43.2 % (ref 36.0–46.0)
Hemoglobin: 13.8 g/dL (ref 12.0–15.0)
Lymphocytes Relative: 36.8 % (ref 12.0–46.0)
Lymphs Abs: 2.8 10*3/uL (ref 0.7–4.0)
MCHC: 31.8 g/dL (ref 30.0–36.0)
MCV: 90.4 fL (ref 78.0–100.0)
Monocytes Absolute: 0.7 10*3/uL (ref 0.1–1.0)
Monocytes Relative: 8.8 % (ref 3.0–12.0)
Neutro Abs: 3.9 10*3/uL (ref 1.4–7.7)
Neutrophils Relative %: 52.8 % (ref 43.0–77.0)
Platelets: 246 10*3/uL (ref 150.0–400.0)
RBC: 4.78 Mil/uL (ref 3.87–5.11)
RDW: 14 % (ref 11.5–15.5)
WBC: 7.5 10*3/uL (ref 4.0–10.5)

## 2023-08-04 LAB — LIPID PANEL
Cholesterol: 192 mg/dL (ref 0–200)
HDL: 56.1 mg/dL (ref >39.00)
LDL Cholesterol: 103 mg/dL — ABNORMAL HIGH (ref 0–99)
NonHDL: 135.79
Total CHOL/HDL Ratio: 3
Triglycerides: 166 mg/dL — ABNORMAL HIGH (ref 0.0–149.0)
VLDL: 33.2 mg/dL (ref 0.0–40.0)

## 2023-08-04 LAB — TSH: TSH: 0.64 u[IU]/mL (ref 0.35–5.50)

## 2023-10-14 DIAGNOSIS — H1030 Unspecified acute conjunctivitis, unspecified eye: Secondary | ICD-10-CM | POA: Diagnosis not present

## 2023-10-21 DIAGNOSIS — M9903 Segmental and somatic dysfunction of lumbar region: Secondary | ICD-10-CM | POA: Diagnosis not present

## 2023-10-21 DIAGNOSIS — M9901 Segmental and somatic dysfunction of cervical region: Secondary | ICD-10-CM | POA: Diagnosis not present

## 2023-10-21 DIAGNOSIS — M9905 Segmental and somatic dysfunction of pelvic region: Secondary | ICD-10-CM | POA: Diagnosis not present

## 2023-10-21 DIAGNOSIS — M9902 Segmental and somatic dysfunction of thoracic region: Secondary | ICD-10-CM | POA: Diagnosis not present

## 2023-10-22 DIAGNOSIS — M9907 Segmental and somatic dysfunction of upper extremity: Secondary | ICD-10-CM | POA: Diagnosis not present

## 2023-10-22 DIAGNOSIS — M9902 Segmental and somatic dysfunction of thoracic region: Secondary | ICD-10-CM | POA: Diagnosis not present

## 2023-10-22 DIAGNOSIS — M9903 Segmental and somatic dysfunction of lumbar region: Secondary | ICD-10-CM | POA: Diagnosis not present

## 2023-10-22 DIAGNOSIS — M9905 Segmental and somatic dysfunction of pelvic region: Secondary | ICD-10-CM | POA: Diagnosis not present

## 2023-10-22 DIAGNOSIS — M9901 Segmental and somatic dysfunction of cervical region: Secondary | ICD-10-CM | POA: Diagnosis not present

## 2023-10-26 DIAGNOSIS — M9905 Segmental and somatic dysfunction of pelvic region: Secondary | ICD-10-CM | POA: Diagnosis not present

## 2023-10-26 DIAGNOSIS — M9901 Segmental and somatic dysfunction of cervical region: Secondary | ICD-10-CM | POA: Diagnosis not present

## 2023-10-26 DIAGNOSIS — M9903 Segmental and somatic dysfunction of lumbar region: Secondary | ICD-10-CM | POA: Diagnosis not present

## 2023-10-26 DIAGNOSIS — M9907 Segmental and somatic dysfunction of upper extremity: Secondary | ICD-10-CM | POA: Diagnosis not present

## 2023-10-26 DIAGNOSIS — M9902 Segmental and somatic dysfunction of thoracic region: Secondary | ICD-10-CM | POA: Diagnosis not present

## 2023-10-29 DIAGNOSIS — M9905 Segmental and somatic dysfunction of pelvic region: Secondary | ICD-10-CM | POA: Diagnosis not present

## 2023-10-29 DIAGNOSIS — M9903 Segmental and somatic dysfunction of lumbar region: Secondary | ICD-10-CM | POA: Diagnosis not present

## 2023-10-29 DIAGNOSIS — M9902 Segmental and somatic dysfunction of thoracic region: Secondary | ICD-10-CM | POA: Diagnosis not present

## 2023-10-29 DIAGNOSIS — M9907 Segmental and somatic dysfunction of upper extremity: Secondary | ICD-10-CM | POA: Diagnosis not present

## 2023-10-29 DIAGNOSIS — M9901 Segmental and somatic dysfunction of cervical region: Secondary | ICD-10-CM | POA: Diagnosis not present

## 2023-11-01 DIAGNOSIS — M9901 Segmental and somatic dysfunction of cervical region: Secondary | ICD-10-CM | POA: Diagnosis not present

## 2023-11-01 DIAGNOSIS — M9905 Segmental and somatic dysfunction of pelvic region: Secondary | ICD-10-CM | POA: Diagnosis not present

## 2023-11-01 DIAGNOSIS — M9903 Segmental and somatic dysfunction of lumbar region: Secondary | ICD-10-CM | POA: Diagnosis not present

## 2023-11-01 DIAGNOSIS — M9907 Segmental and somatic dysfunction of upper extremity: Secondary | ICD-10-CM | POA: Diagnosis not present

## 2023-11-01 DIAGNOSIS — M9902 Segmental and somatic dysfunction of thoracic region: Secondary | ICD-10-CM | POA: Diagnosis not present

## 2023-11-08 DIAGNOSIS — M9901 Segmental and somatic dysfunction of cervical region: Secondary | ICD-10-CM | POA: Diagnosis not present

## 2023-11-08 DIAGNOSIS — M9905 Segmental and somatic dysfunction of pelvic region: Secondary | ICD-10-CM | POA: Diagnosis not present

## 2023-11-08 DIAGNOSIS — M9907 Segmental and somatic dysfunction of upper extremity: Secondary | ICD-10-CM | POA: Diagnosis not present

## 2023-11-08 DIAGNOSIS — M9902 Segmental and somatic dysfunction of thoracic region: Secondary | ICD-10-CM | POA: Diagnosis not present

## 2023-11-08 DIAGNOSIS — M9903 Segmental and somatic dysfunction of lumbar region: Secondary | ICD-10-CM | POA: Diagnosis not present

## 2023-11-15 DIAGNOSIS — M9901 Segmental and somatic dysfunction of cervical region: Secondary | ICD-10-CM | POA: Diagnosis not present

## 2023-11-15 DIAGNOSIS — M9903 Segmental and somatic dysfunction of lumbar region: Secondary | ICD-10-CM | POA: Diagnosis not present

## 2023-11-15 DIAGNOSIS — M9905 Segmental and somatic dysfunction of pelvic region: Secondary | ICD-10-CM | POA: Diagnosis not present

## 2023-11-15 DIAGNOSIS — M9907 Segmental and somatic dysfunction of upper extremity: Secondary | ICD-10-CM | POA: Diagnosis not present

## 2023-11-15 DIAGNOSIS — M9902 Segmental and somatic dysfunction of thoracic region: Secondary | ICD-10-CM | POA: Diagnosis not present

## 2023-11-26 DIAGNOSIS — M9905 Segmental and somatic dysfunction of pelvic region: Secondary | ICD-10-CM | POA: Diagnosis not present

## 2023-11-26 DIAGNOSIS — M9902 Segmental and somatic dysfunction of thoracic region: Secondary | ICD-10-CM | POA: Diagnosis not present

## 2023-11-26 DIAGNOSIS — M9901 Segmental and somatic dysfunction of cervical region: Secondary | ICD-10-CM | POA: Diagnosis not present

## 2023-11-26 DIAGNOSIS — M9907 Segmental and somatic dysfunction of upper extremity: Secondary | ICD-10-CM | POA: Diagnosis not present

## 2023-11-26 DIAGNOSIS — M9903 Segmental and somatic dysfunction of lumbar region: Secondary | ICD-10-CM | POA: Diagnosis not present

## 2023-12-02 DIAGNOSIS — M9905 Segmental and somatic dysfunction of pelvic region: Secondary | ICD-10-CM | POA: Diagnosis not present

## 2023-12-02 DIAGNOSIS — M9902 Segmental and somatic dysfunction of thoracic region: Secondary | ICD-10-CM | POA: Diagnosis not present

## 2023-12-02 DIAGNOSIS — M9903 Segmental and somatic dysfunction of lumbar region: Secondary | ICD-10-CM | POA: Diagnosis not present

## 2023-12-02 DIAGNOSIS — M9907 Segmental and somatic dysfunction of upper extremity: Secondary | ICD-10-CM | POA: Diagnosis not present

## 2023-12-02 DIAGNOSIS — M9901 Segmental and somatic dysfunction of cervical region: Secondary | ICD-10-CM | POA: Diagnosis not present

## 2023-12-10 DIAGNOSIS — M9901 Segmental and somatic dysfunction of cervical region: Secondary | ICD-10-CM | POA: Diagnosis not present

## 2023-12-10 DIAGNOSIS — M9903 Segmental and somatic dysfunction of lumbar region: Secondary | ICD-10-CM | POA: Diagnosis not present

## 2023-12-10 DIAGNOSIS — M9902 Segmental and somatic dysfunction of thoracic region: Secondary | ICD-10-CM | POA: Diagnosis not present

## 2023-12-10 DIAGNOSIS — M9905 Segmental and somatic dysfunction of pelvic region: Secondary | ICD-10-CM | POA: Diagnosis not present

## 2023-12-10 DIAGNOSIS — M9907 Segmental and somatic dysfunction of upper extremity: Secondary | ICD-10-CM | POA: Diagnosis not present

## 2023-12-24 DIAGNOSIS — M9903 Segmental and somatic dysfunction of lumbar region: Secondary | ICD-10-CM | POA: Diagnosis not present

## 2023-12-24 DIAGNOSIS — M9902 Segmental and somatic dysfunction of thoracic region: Secondary | ICD-10-CM | POA: Diagnosis not present

## 2023-12-24 DIAGNOSIS — M9905 Segmental and somatic dysfunction of pelvic region: Secondary | ICD-10-CM | POA: Diagnosis not present

## 2023-12-24 DIAGNOSIS — M9907 Segmental and somatic dysfunction of upper extremity: Secondary | ICD-10-CM | POA: Diagnosis not present

## 2023-12-24 DIAGNOSIS — M9901 Segmental and somatic dysfunction of cervical region: Secondary | ICD-10-CM | POA: Diagnosis not present

## 2023-12-31 DIAGNOSIS — M9902 Segmental and somatic dysfunction of thoracic region: Secondary | ICD-10-CM | POA: Diagnosis not present

## 2023-12-31 DIAGNOSIS — M9905 Segmental and somatic dysfunction of pelvic region: Secondary | ICD-10-CM | POA: Diagnosis not present

## 2023-12-31 DIAGNOSIS — M9901 Segmental and somatic dysfunction of cervical region: Secondary | ICD-10-CM | POA: Diagnosis not present

## 2023-12-31 DIAGNOSIS — M9903 Segmental and somatic dysfunction of lumbar region: Secondary | ICD-10-CM | POA: Diagnosis not present

## 2023-12-31 DIAGNOSIS — M9907 Segmental and somatic dysfunction of upper extremity: Secondary | ICD-10-CM | POA: Diagnosis not present

## 2024-01-14 DIAGNOSIS — M9903 Segmental and somatic dysfunction of lumbar region: Secondary | ICD-10-CM | POA: Diagnosis not present

## 2024-01-14 DIAGNOSIS — M9902 Segmental and somatic dysfunction of thoracic region: Secondary | ICD-10-CM | POA: Diagnosis not present

## 2024-01-14 DIAGNOSIS — M9907 Segmental and somatic dysfunction of upper extremity: Secondary | ICD-10-CM | POA: Diagnosis not present

## 2024-01-14 DIAGNOSIS — M9905 Segmental and somatic dysfunction of pelvic region: Secondary | ICD-10-CM | POA: Diagnosis not present

## 2024-01-14 DIAGNOSIS — M9901 Segmental and somatic dysfunction of cervical region: Secondary | ICD-10-CM | POA: Diagnosis not present

## 2024-01-26 DIAGNOSIS — M9903 Segmental and somatic dysfunction of lumbar region: Secondary | ICD-10-CM | POA: Diagnosis not present

## 2024-01-26 DIAGNOSIS — M9901 Segmental and somatic dysfunction of cervical region: Secondary | ICD-10-CM | POA: Diagnosis not present

## 2024-01-26 DIAGNOSIS — M9905 Segmental and somatic dysfunction of pelvic region: Secondary | ICD-10-CM | POA: Diagnosis not present

## 2024-01-26 DIAGNOSIS — M9902 Segmental and somatic dysfunction of thoracic region: Secondary | ICD-10-CM | POA: Diagnosis not present

## 2024-02-14 DIAGNOSIS — M9905 Segmental and somatic dysfunction of pelvic region: Secondary | ICD-10-CM | POA: Diagnosis not present

## 2024-02-14 DIAGNOSIS — M9903 Segmental and somatic dysfunction of lumbar region: Secondary | ICD-10-CM | POA: Diagnosis not present

## 2024-02-14 DIAGNOSIS — M9901 Segmental and somatic dysfunction of cervical region: Secondary | ICD-10-CM | POA: Diagnosis not present

## 2024-02-14 DIAGNOSIS — M9902 Segmental and somatic dysfunction of thoracic region: Secondary | ICD-10-CM | POA: Diagnosis not present

## 2024-03-03 DIAGNOSIS — M9903 Segmental and somatic dysfunction of lumbar region: Secondary | ICD-10-CM | POA: Diagnosis not present

## 2024-03-03 DIAGNOSIS — M9905 Segmental and somatic dysfunction of pelvic region: Secondary | ICD-10-CM | POA: Diagnosis not present

## 2024-03-03 DIAGNOSIS — M9901 Segmental and somatic dysfunction of cervical region: Secondary | ICD-10-CM | POA: Diagnosis not present

## 2024-03-03 DIAGNOSIS — M9902 Segmental and somatic dysfunction of thoracic region: Secondary | ICD-10-CM | POA: Diagnosis not present

## 2024-03-14 ENCOUNTER — Ambulatory Visit (INDEPENDENT_AMBULATORY_CARE_PROVIDER_SITE_OTHER): Admitting: Internal Medicine

## 2024-03-14 ENCOUNTER — Encounter: Payer: Self-pay | Admitting: Internal Medicine

## 2024-03-14 VITALS — BP 122/80 | HR 70 | Ht 63.5 in | Wt 191.0 lb

## 2024-03-14 DIAGNOSIS — D352 Benign neoplasm of pituitary gland: Secondary | ICD-10-CM

## 2024-03-14 DIAGNOSIS — E221 Hyperprolactinemia: Secondary | ICD-10-CM

## 2024-03-14 MED ORDER — DEXAMETHASONE 1 MG PO TABS: 1.0000 mg | ORAL_TABLET | Freq: Once | ORAL | 0 refills | Status: AC

## 2024-03-14 NOTE — Progress Notes (Signed)
 Name: Monica Jacobs  MRN/ DOB: [id number], 1991/05/05    Age/ Sex: 33 y.o., female     PCP: Alexander Iba, PA   Reason for Endocrinology Evaluation: Pituitary microadenoma      Initial Endocrinology Clinic Visit: 02/12/2021    PATIENT IDENTIFIER: Monica Jacobs is a 33 y.o., female with a past medical history of .Pituitary microadenoma  She has followed with [facility] since 02/12/21 for consultative assistance with management of her Pituitary microadenoma .      HISTORICAL SUMMARY:   During evaluation for oligomenorrhea  In 10/2020 she was noted to have elevated prolactin 86.1 ng/mL which prompted a brain MRI revealing a pituitary microadenoma of 8 mm .     She is S/P C-section 07/27/2019 , she pumped for ~ 6 months post delivery , but continued to have nipple discharge until 11/2020  Her prolactin level was slightly elevated at our clinic at 33.3 NG/mL, she was started on cabergoline  in 01/2021   Prior to pregnancy she had regular periods but since delivery she noted irregular periods. Menarche at age 12   No FH of pituitary disease    On her 08/2021 visit the patient ran out of cabergoline , and we opted to remain off since her prolactin level was within normal range.  She became pregnant by 03/2022 and we stopped cabergoline    She is s/p C-section 11/26/2022- Boy  SUBJECTIVE:   Today (03/14/2024):  Monica Jacobs is here for a follow-up on pituitary microadenoma  She is S/P delivery of a baby boy 11/2022 She pumped until 11/2023  Continues with nipple discharge, mainly at night , bilaterally  No headaches  No vision changes , last eye exam 2024 Denies bowel movement changes - she is on fiber pill Denies nausea or vomiting  LMP last month, regular  No OCP's  Pt  She is c/o difficult to lose weight , on Compounded GLP-1 for ~ 2 months with no weight changes, she exercises.     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   Depression    Environmental allergies    H/O: pituitary tumor    Past Surgical History:  Past Surgical History:  Procedure Laterality Date   CESAREAN SECTION N/A 07/27/2019   Procedure: CESAREAN SECTION;  Surgeon: Dyanna Glasgow, DO;  Location: [facility];  Service: Obstetrics;  Laterality: N/A;  Primary edc 08/03/19 NKDA Tracey RNFA   CESAREAN SECTION N/A 11/26/2022   Procedure: REPEAT CESAREAN SECTION EDC: 12-03-22  ALLERG: NKDA  PREVIOUS X 1;  Surgeon: Dyanna Glasgow, DO;  Location: [facility];  Service: Obstetrics;  Laterality: N/A;   EYE SURGERY     NO PAST SURGERIES     Social History:  reports that she has never smoked. She has never used smokeless tobacco. She reports current alcohol use. She reports that she does not use drugs. Family History:  Family History  Problem Relation Age of Onset   Anxiety disorder Mother    Hypercholesterolemia Mother    ADD / ADHD Mother    Cancer Father        skin   Heart attack Maternal Grandmother    Mental illness Sister    Diabetes Maternal Grandfather    Heart disease Maternal Grandfather    Colon cancer Neg Hx    Breast cancer Neg Hx    Prostate cancer Neg Hx  HOME MEDICATIONS: Allergies as of 03/14/2024   No Known Allergies      Medication List        Accurate as of Mar 14, 2024  8:23 AM. If you have any questions, ask your nurse or doctor.          STOP taking these medications    acetaminophen  500 MG tablet Commonly known as: TYLENOL  Stopped by: Camilla Cedar Emmogene Simson   diphenhydrAMINE  25 MG tablet Commonly known as: BENADRYL  Stopped by: Camilla Cedar Virga Haltiwanger   ofloxacin 0.3 % ophthalmic solution Commonly known as: OCUFLOX Stopped by: Jillaine Waren J Galileah Piggee       TAKE these medications    multivitamin tablet Take 1 tablet by mouth daily.          OBJECTIVE:   PHYSICAL EXAM: VS: BP 122/80 (BP Location: Left Arm, Patient Position: Sitting, Cuff  Size: Normal)   Pulse 70   Ht 5' 3.5" (1.613 m)   Wt 191 lb (86.6 kg)   SpO2 99%   BMI 33.30 kg/m    EXAM: General: Pt appears well and is in NAD  Neck: General: Supple without adenopathy. Thyroid : Thyroid  size normal.  No goiter or nodules appreciated.   Lungs: Clear with good BS   Heart: Auscultation: RRR.  Abdomen: Soft, nontender  Extremities:  BL LE: No pretibial edema   Mental Status: Judgment, insight: Intact Mood and affect: No depression, anxiety, or agitation     DATA REVIEWED:    Latest Reference Range & Units 03/14/24 08:51  Sodium 135 - 146 mmol/L 140  Potassium 3.5 - 5.3 mmol/L 4.5  Chloride 98 - 110 mmol/L 104  CO2 20 - 32 mmol/L 27  Glucose 65 - 99 mg/dL 76  BUN 7 - 25 mg/dL 11  Creatinine 1.61 - 0.96 mg/dL 0.45  Calcium 8.6 - 40.9 mg/dL 9.8  BUN/Creatinine Ratio 6 - 22 (calc) SEE NOTE:  eGFR > OR = 60 mL/min/1.71m2 96  LH mIU/mL 4.7  FSH mIU/mL 3.9  Prolactin ng/mL 39.1 (H)  Estradiol  pg/mL 29  TSH mIU/L 0.67  T4,Free(Direct) 0.8 - 1.8 ng/dL 0.9    Brain MRI 05/26/1913     There is a 7 x 8 x 6 mm T2 hyperintense (series 5, image 7) and hypoenhancing lesion in the posterior left pituitary gland. The lesion appears to have some enhancement along its periphery, which doesn't change substantially with dynamic contrast. The central portion lesion is largely hypoenhancing on dynamic contrast. Resulting slight upward convexity of the left pituitary. Infundibulum is within normal limits and essentially midline.    MRI 02/19/2022  Approximately 8 mm hypoenhancing lesion in the posterior left pituitary. Differential considerations include a pituitary microadenoma (given provided clinical history and suggestion of some enhancement along the periphery of the lesion) versus a pituitary cyst (given T2 hyperintensity and little enhancement centrally with dynamic contrast).      ASSESSMENT / PLAN / RECOMMENDATIONS:   Pituitary Microadenoma:   - No  local symptoms - Pituitary hormones, have all been normal except prolactin in the past - Update visual field normal   - Will proceed with repeat pituitary MRI -Due to inability to lose weight, we will proceed with dexamethasone  suppression test   2. Prolactinoma :     -She has been off cabergoline  since 2023 when she became pregnant - Prolactin levels today continues to be elevated, will restart cabergoline    Medication Start cabergoline  0.5 mg, half a tablet twice weekly   Follow-up in 4 months  Signed electronically by: Natale Bail, MD  [facility] End[facility]  [facility] [address] [place], [place] [address] Phone: [phone] FAX: [phone]      CC: Alexander Iba, [place] [address] [place] [address] Phone: [phone]  Fax: [phone]   Return to Endocrinology clinic as below: Future Appointments  Date Time Provider Department Center  07/04/2024  9:00 AM Alexander Iba, [place] [facility]

## 2024-03-14 NOTE — Patient Instructions (Signed)
   Instructions for Dexamethasone Suppression Test   Step 1: Choose a morning when you can come to our lab at 8:00 am for a blood draw.   Step 2: On the night before the blood draw, take one 1 mg tablet of dexamethasone at 11:30 pm.  The timing is VERY important!   Step 3: The next morning, go to the lab for blood work at 8:00 am.  Bonita Quin do not have to be on an empty stomach, but the timing is VERY important!

## 2024-03-15 ENCOUNTER — Ambulatory Visit: Payer: Self-pay | Admitting: Internal Medicine

## 2024-03-15 DIAGNOSIS — R7989 Other specified abnormal findings of blood chemistry: Secondary | ICD-10-CM

## 2024-03-15 MED ORDER — CABERGOLINE 0.5 MG PO TABS: 0.2500 mg | ORAL_TABLET | ORAL | 2 refills | Status: DC

## 2024-03-17 DIAGNOSIS — M9902 Segmental and somatic dysfunction of thoracic region: Secondary | ICD-10-CM | POA: Diagnosis not present

## 2024-03-17 DIAGNOSIS — M9901 Segmental and somatic dysfunction of cervical region: Secondary | ICD-10-CM | POA: Diagnosis not present

## 2024-03-17 DIAGNOSIS — M9903 Segmental and somatic dysfunction of lumbar region: Secondary | ICD-10-CM | POA: Diagnosis not present

## 2024-03-17 DIAGNOSIS — M9905 Segmental and somatic dysfunction of pelvic region: Secondary | ICD-10-CM | POA: Diagnosis not present

## 2024-03-18 LAB — BASIC METABOLIC PANEL WITH GFR
BUN: 11 mg/dL (ref 7–25)
CO2: 27 mmol/L (ref 20–32)
Calcium: 9.8 mg/dL (ref 8.6–10.2)
Chloride: 104 mmol/L (ref 98–110)
Creat: 0.82 mg/dL (ref 0.50–0.97)
Glucose, Bld: 76 mg/dL (ref 65–99)
Potassium: 4.5 mmol/L (ref 3.5–5.3)
Sodium: 140 mmol/L (ref 135–146)
eGFR: 96 mL/min/{1.73_m2} (ref >=60)

## 2024-03-18 LAB — INSULIN-LIKE GROWTH FACTOR
IGF-I, LC/MS: 300 ng/mL (ref 53–331)
Z-Score (Female): 1.7 {STDV} (ref ?–2.0)

## 2024-03-18 LAB — TSH: TSH: 0.67 m[IU]/L

## 2024-03-18 LAB — PROLACTIN: Prolactin: 39.1 ng/mL — ABNORMAL HIGH

## 2024-03-18 LAB — FOLLICLE STIMULATING HORMONE: FSH: 3.9 m[IU]/mL

## 2024-03-18 LAB — T4, FREE: Free T4: 0.9 ng/dL (ref 0.8–1.8)

## 2024-03-18 LAB — CORTISOL: Cortisol, Plasma: 19.4 ug/dL

## 2024-03-18 LAB — ACTH: C206 ACTH: 45 pg/mL (ref 6–50)

## 2024-03-18 LAB — ESTRADIOL: Estradiol: 29 pg/mL

## 2024-03-18 LAB — LUTEINIZING HORMONE: LH: 4.7 m[IU]/mL

## 2024-03-22 ENCOUNTER — Other Ambulatory Visit

## 2024-03-22 DIAGNOSIS — E221 Hyperprolactinemia: Secondary | ICD-10-CM | POA: Diagnosis not present

## 2024-03-23 LAB — CORTISOL: Cortisol, Plasma: 15.8 ug/dL

## 2024-03-27 NOTE — Telephone Encounter (Signed)
 Called and scheduled Lab appt.

## 2024-03-29 ENCOUNTER — Other Ambulatory Visit

## 2024-04-04 ENCOUNTER — Other Ambulatory Visit

## 2024-04-04 DIAGNOSIS — R7989 Other specified abnormal findings of blood chemistry: Secondary | ICD-10-CM | POA: Diagnosis not present

## 2024-04-07 DIAGNOSIS — M9902 Segmental and somatic dysfunction of thoracic region: Secondary | ICD-10-CM | POA: Diagnosis not present

## 2024-04-07 DIAGNOSIS — M9901 Segmental and somatic dysfunction of cervical region: Secondary | ICD-10-CM | POA: Diagnosis not present

## 2024-04-07 DIAGNOSIS — M9903 Segmental and somatic dysfunction of lumbar region: Secondary | ICD-10-CM | POA: Diagnosis not present

## 2024-04-07 DIAGNOSIS — M9905 Segmental and somatic dysfunction of pelvic region: Secondary | ICD-10-CM | POA: Diagnosis not present

## 2024-04-10 LAB — CORTISOL, URINE, 24 HOUR
24 Hour urine volume (VMAHVA): 1200 mL
CREATININE, URINE: 1.36 g/(24.h) (ref 0.50–2.15)
Cortisol (Ur), Free: 44.5 ug/(24.h) (ref 4.0–50.0)

## 2024-04-19 ENCOUNTER — Encounter: Payer: Self-pay | Admitting: Internal Medicine

## 2024-04-24 ENCOUNTER — Ambulatory Visit
Admission: RE | Admit: 2024-04-24 | Discharge: 2024-04-24 | Disposition: A | Discharge status: Home / Self Care | Source: Ambulatory Visit | Attending: Internal Medicine | Admitting: Internal Medicine

## 2024-04-24 DIAGNOSIS — E237 Disorder of pituitary gland, unspecified: Secondary | ICD-10-CM | POA: Diagnosis not present

## 2024-04-24 DIAGNOSIS — D352 Benign neoplasm of pituitary gland: Secondary | ICD-10-CM

## 2024-04-24 MED ORDER — GADOPICLENOL 0.5 MMOL/ML IV SOLN
9.0000 mL | Freq: Once | INTRAVENOUS | Status: AC | PRN
  Administered 2024-04-24: 9 mL via INTRAVENOUS

## 2024-04-25 DIAGNOSIS — M9905 Segmental and somatic dysfunction of pelvic region: Secondary | ICD-10-CM | POA: Diagnosis not present

## 2024-04-25 DIAGNOSIS — M9903 Segmental and somatic dysfunction of lumbar region: Secondary | ICD-10-CM | POA: Diagnosis not present

## 2024-04-25 DIAGNOSIS — M9901 Segmental and somatic dysfunction of cervical region: Secondary | ICD-10-CM | POA: Diagnosis not present

## 2024-04-25 DIAGNOSIS — M9902 Segmental and somatic dysfunction of thoracic region: Secondary | ICD-10-CM | POA: Diagnosis not present

## 2024-05-30 DIAGNOSIS — M9903 Segmental and somatic dysfunction of lumbar region: Secondary | ICD-10-CM | POA: Diagnosis not present

## 2024-05-30 DIAGNOSIS — M9905 Segmental and somatic dysfunction of pelvic region: Secondary | ICD-10-CM | POA: Diagnosis not present

## 2024-05-30 DIAGNOSIS — M9901 Segmental and somatic dysfunction of cervical region: Secondary | ICD-10-CM | POA: Diagnosis not present

## 2024-05-30 DIAGNOSIS — M9902 Segmental and somatic dysfunction of thoracic region: Secondary | ICD-10-CM | POA: Diagnosis not present

## 2024-07-04 ENCOUNTER — Encounter: Payer: Self-pay | Admitting: Physician Assistant

## 2024-07-04 ENCOUNTER — Ambulatory Visit: Payer: BC Managed Care – PPO | Admitting: Physician Assistant

## 2024-07-04 VITALS — BP 120/74 | HR 80 | Temp 97.9°F | Ht 63.5 in | Wt 181.4 lb

## 2024-07-04 DIAGNOSIS — F909 Attention-deficit hyperactivity disorder, unspecified type: Secondary | ICD-10-CM

## 2024-07-04 DIAGNOSIS — Z13220 Encounter for screening for lipoid disorders: Secondary | ICD-10-CM

## 2024-07-04 DIAGNOSIS — Z23 Encounter for immunization: Secondary | ICD-10-CM | POA: Diagnosis not present

## 2024-07-04 DIAGNOSIS — D352 Benign neoplasm of pituitary gland: Secondary | ICD-10-CM

## 2024-07-04 DIAGNOSIS — Z0000 Encounter for general adult medical examination without abnormal findings: Secondary | ICD-10-CM | POA: Diagnosis not present

## 2024-07-04 DIAGNOSIS — Z6791 Unspecified blood type, Rh negative: Secondary | ICD-10-CM | POA: Insufficient documentation

## 2024-07-04 LAB — LIPID PANEL
Cholesterol: 160 mg/dL (ref 0–200)
HDL: 47.2 mg/dL (ref >39.00)
LDL Cholesterol: 96 mg/dL (ref 0–99)
NonHDL: 112.93
Total CHOL/HDL Ratio: 3
Triglycerides: 85 mg/dL (ref 0.0–149.0)
VLDL: 17 mg/dL (ref 0.0–40.0)

## 2024-07-04 LAB — CBC WITH DIFFERENTIAL/PLATELET
Basophils Absolute: 0 K/uL (ref 0.0–0.1)
Basophils Relative: 0.6 % (ref 0.0–3.0)
Eosinophils Absolute: 0 K/uL (ref 0.0–0.7)
Eosinophils Relative: 0.4 % (ref 0.0–5.0)
HCT: 40.6 % (ref 36.0–46.0)
Hemoglobin: 13.4 g/dL (ref 12.0–15.0)
Lymphocytes Relative: 32.6 % (ref 12.0–46.0)
Lymphs Abs: 2.6 K/uL (ref 0.7–4.0)
MCHC: 33 g/dL (ref 30.0–36.0)
MCV: 88.7 fl (ref 78.0–100.0)
Monocytes Absolute: 0.5 K/uL (ref 0.1–1.0)
Monocytes Relative: 6.7 % (ref 3.0–12.0)
Neutro Abs: 4.7 K/uL (ref 1.4–7.7)
Neutrophils Relative %: 59.7 % (ref 43.0–77.0)
Platelets: 221 K/uL (ref 150.0–400.0)
RBC: 4.58 Mil/uL (ref 3.87–5.11)
RDW: 14.4 % (ref 11.5–15.5)
WBC: 7.9 K/uL (ref 4.0–10.5)

## 2024-07-04 LAB — COMPREHENSIVE METABOLIC PANEL WITH GFR
ALT: 21 U/L (ref 0–35)
AST: 15 U/L (ref 0–37)
Albumin: 4.3 g/dL (ref 3.5–5.2)
Alkaline Phosphatase: 40 U/L (ref 39–117)
BUN: 11 mg/dL (ref 6–23)
CO2: 27 meq/L (ref 19–32)
Calcium: 9.5 mg/dL (ref 8.4–10.5)
Chloride: 106 meq/L (ref 96–112)
Creatinine, Ser: 0.86 mg/dL (ref 0.40–1.20)
GFR: 87.52 mL/min (ref >60.00)
Glucose, Bld: 88 mg/dL (ref 70–99)
Potassium: 4.5 meq/L (ref 3.5–5.1)
Sodium: 140 meq/L (ref 135–145)
Total Bilirubin: 0.4 mg/dL (ref 0.2–1.2)
Total Protein: 7.1 g/dL (ref 6.0–8.3)

## 2024-07-04 MED ORDER — ATOMOXETINE HCL 10 MG PO CAPS: 10.0000 mg | ORAL_CAPSULE | Freq: Every day | ORAL | 0 refills | Status: DC

## 2024-07-04 NOTE — Progress Notes (Signed)
 Subjective:    Monica Jacobs is a 33 y.o. female and is here for a comprehensive physical exam.  HPI  There are no preventive care reminders to display for this patient.  Discussed the use of AI scribe software for clinical note transcription with the patient, who gave verbal consent to proceed.  History of Present Illness Monica Jacobs is a 33 year old female who presents for follow-up regarding weight management and cortisol levels.  She experiences difficulty with weight loss despite medication use since April. Initially, a GLP-1 agonist was ineffective, but a compounded medication has resulted in a 10-pound weight loss. She exercises four to five times weekly and eats less, but weight loss remains slow. Weight gain ceased after stopping breastfeeding.  Cortisol levels were tested due to concerns about her impact on weight loss. Cortisol did not decrease after a suppression pill, though a 24-hour urine test showed normal levels. She finds this confusing as her cortisol was never initially high. There is a family history of diabetes, but she does not believe she has insulin  resistance.  She has a history of prolactinoma and is on cabergoline . Prolactin levels have not been checked since resuming the medication post-breastfeeding. She is interested in rechecking these levels due to potential impacts on weight loss.  She is a light sleeper since her first pregnancy, attributing it to anxiety and alertness for her children. She has difficulty falling and staying asleep if her children wake during the night.  She has a history of ADHD and previously used medication but stopped due to side effects. She is considering a non-stimulant medication to improve focus without side effects. Her menstrual cycles are regular and predictable. No headaches, joint pain, numbness, tingling, tremor, or leg swelling.    Health Maintenance: Immunizations -- UpToDate; planning to get COVID vaccine later  this fall Colonoscopy -- n/a Mammogram -- n/a PAP -- UpToDate  Bone Density -- n/a Diet -- very healthy diet Exercise -- daily  Sleep habits -- difficulty falling asleep/staying asleep Mood -- no major concerns  UTD with dentist? - yes UTD with eye doctor? - yes  Weight history: Wt Readings from Last 10 Encounters:  07/04/24 181 lb 6.1 oz (82.3 kg)  03/14/24 191 lb (86.6 kg)  08/03/23 188 lb 8 oz (85.5 kg)  11/26/22 214 lb 14.4 oz (97.5 kg)  11/12/22 205 lb (93 kg)  06/09/22 182 lb 6.4 oz (82.7 kg)  04/16/22 179 lb 4 oz (81.3 kg)  02/06/22 180 lb (81.6 kg)  02/03/22 179 lb 6.1 oz (81.4 kg)  01/22/22 180 lb (81.6 kg)   Body mass index is 31.63 kg/m. Patient's last menstrual period was 06/21/2024 (exact date).  Alcohol use:  reports current alcohol use.  Tobacco use:  Tobacco Use: Low Risk  (07/04/2024)   Patient History    Smoking Tobacco Use: Never    Smokeless Tobacco Use: Never    Passive Exposure: Not on file   Eligible for lung cancer screening? no     07/04/2024    9:05 AM  Depression screen PHQ 2/9  Decreased Interest 0  Down, Depressed, Hopeless 0  PHQ - 2 Score 0     Other providers/specialists: Patient Care Team: Job Lukes, [place] as PCP - General (Physician Assistant)    PMHx, SurgHx, SocialHx, Medications, and Allergies were reviewed in the Visit Navigator and updated as appropriate.   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  Depression    Environmental allergies    H/O: pituitary tumor      Past Surgical History:  Procedure Laterality Date   CESAREAN SECTION N/A 07/27/2019   Procedure: CESAREAN SECTION;  Surgeon: Dannielle Bouchard, DO;  Location: [facility];  Service: Obstetrics;  Laterality: N/A;  Primary edc 08/03/19 NKDA Tracey RNFA   CESAREAN SECTION N/A 11/26/2022   Procedure: REPEAT CESAREAN SECTION EDC: 12-03-22  ALLERG: NKDA  PREVIOUS X 1;  Surgeon: Dannielle Bouchard, DO;  Location: [facility] LD  ORS;  Service: Obstetrics;  Laterality: N/A;   EYE SURGERY     NO PAST SURGERIES       Family History  Problem Relation Age of Onset   Anxiety disorder Mother    Hypercholesterolemia Mother    ADD / ADHD Mother    Cancer Father        skin   Mental illness Sister    Heart attack Maternal Grandmother    Diabetes Maternal Grandfather    Heart disease Maternal Grandfather    Asthma Son    Colon cancer Neg Hx    Breast cancer Neg Hx    Prostate cancer Neg Hx     Social History   Tobacco Use   Smoking status: Never   Smokeless tobacco: Never  Vaping Use   Vaping status: Never Used  Substance Use Topics   Alcohol use: Yes    Comment: Occasionally   Drug use: No    Review of Systems:   Review of Systems  Constitutional:  Negative for chills, fever, malaise/fatigue and weight loss.  HENT:  Negative for hearing loss, sinus pain and sore throat.   Respiratory:  Negative for cough and hemoptysis.   Cardiovascular:  Negative for chest pain, palpitations, leg swelling and PND.  Gastrointestinal:  Negative for abdominal pain, constipation, diarrhea, heartburn, nausea and vomiting.  Genitourinary:  Negative for dysuria, frequency and urgency.  Musculoskeletal:  Negative for back pain, myalgias and neck pain.  Skin:  Negative for itching and rash.  Neurological:  Negative for dizziness, tingling, seizures and headaches.  Endo/Heme/Allergies:  Negative for polydipsia.  Psychiatric/Behavioral:  Negative for depression. The patient is not nervous/anxious.     Objective:   BP 120/74 (BP Location: Left Arm, Patient Position: Sitting, Cuff Size: Large)   Pulse 80   Temp 97.9 F (36.6 C) (Temporal)   Ht 5' 3.5 (1.613 m)   Wt 181 lb 6.1 oz (82.3 kg)   LMP 06/21/2024 (Exact Date)   SpO2 99%   Breastfeeding No   BMI 31.63 kg/m  Body mass index is 31.63 kg/m.   General Appearance:    Alert, cooperative, no distress, appears stated age  Head:    Normocephalic, without  obvious abnormality, atraumatic  Eyes:    PERRL, conjunctiva/corneas clear, EOM's intact, fundi    benign, both eyes  Ears:    Normal TM's and external ear canals, both ears  Nose:   Nares normal, septum midline, mucosa normal, no drainage    or sinus tenderness  Throat:   Lips, mucosa, and tongue normal; teeth and gums normal  Neck:   Supple, symmetrical, trachea midline, no adenopathy;    thyroid :  no enlargement/tenderness/nodules; no carotid   bruit or JVD  Back:     Symmetric, no curvature, ROM normal, no CVA tenderness  Lungs:     Clear to auscultation bilaterally, respirations unlabored  Chest Wall:    No tenderness or deformity   Heart:    Regular  rate and rhythm, S1 and S2 normal, no murmur, rub or gallop  Breast Exam:    Deferred  Abdomen:     Soft, non-tender, bowel sounds active all four quadrants,    no masses, no organomegaly  Genitalia:    Deferred  Extremities:   Extremities normal, atraumatic, no cyanosis or edema  Pulses:   2+ and symmetric all extremities  Skin:   Skin color, texture, turgor normal, no rashes or lesions  Lymph nodes:   Cervical, supraclavicular, and axillary nodes normal  Neurologic:   CNII-XII intact, normal strength, sensation and reflexes    throughout    Assessment/Plan:   Assessment and Plan Assessment & Plan Comprehensive Physical Exam (CPE) preventive care annual visit Today patient counseled on age appropriate routine health concerns for screening and prevention, each reviewed and up to date or declined. Immunizations reviewed and up to date or declined. Labs ordered and reviewed. Risk factors for depression reviewed and negative. Hearing function and visual acuity are intact. ADLs screened and addressed as needed. Functional ability and level of safety reviewed and appropriate. Education, counseling and referrals performed based on assessed risks today. Patient provided with a copy of personalized plan for preventive  services.  Obesity Weight loss of 10 pounds with compounded medication since April. Previous GLP-1 agonist ineffective. Cortisol levels normal but not suppressed by dexamethasone . Prolactinoma and cortisol regulation may affect weight loss. - Consider switching to branded weight loss medication if cost-effective and potentially more effective. - Continue regular exercise and healthy eating habits. - Monitor weight and symptoms related to cortisol and prolactinoma.  Prolactinoma On cabergoline  to lower prolactin. Prolactin levels not checked since restarting medication post-breastfeeding. No headaches reported. - Order blood test to check prolactin levels.  Attention-deficit hyperactivity disorder Stimulant medication previously caused tremors and jitteriness. Interested in non-stimulant option. - Prescribe Strattera  starting at 10 mg daily. - Monitor for effectiveness and side effects.    Lucie Buttner, PA-C [facility]

## 2024-07-04 NOTE — Patient Instructions (Signed)
 It was great to see you!  Trial the 10 mg Strattera  and let me know how you do   Take care,  Grainger Mccarley PA-C

## 2024-07-05 ENCOUNTER — Ambulatory Visit: Payer: Self-pay | Admitting: Physician Assistant

## 2024-07-05 NOTE — Progress Notes (Signed)
 Penny in lab taking care of lab order Prolactin.

## 2024-07-06 ENCOUNTER — Encounter: Payer: Self-pay | Admitting: Physician Assistant

## 2024-07-06 LAB — PROLACTIN

## 2024-07-06 LAB — EXTRA LAV TOP TUBE

## 2024-07-10 ENCOUNTER — Other Ambulatory Visit: Payer: Self-pay | Admitting: Physician Assistant

## 2024-07-10 ENCOUNTER — Telehealth: Payer: Self-pay | Admitting: *Deleted

## 2024-07-10 ENCOUNTER — Other Ambulatory Visit (HOSPITAL_COMMUNITY): Payer: Self-pay

## 2024-07-10 MED ORDER — TIRZEPATIDE 2.5 MG/0.5ML ~~LOC~~ SOAJ: 2.5000 mg | SUBCUTANEOUS | 2 refills | Status: DC

## 2024-07-10 MED ORDER — ZEPBOUND 2.5 MG/0.5ML ~~LOC~~ SOAJ: 2.5000 mg | SUBCUTANEOUS | 0 refills | Status: AC

## 2024-07-10 NOTE — Telephone Encounter (Signed)
 Monica Jacobs, does pt want Rx for Zepbound  sent to pharmacy to try to run through insurance?

## 2024-07-10 NOTE — Telephone Encounter (Signed)
 Please do PA for Zepbound  2.5 mg. Do not due for Mounjaro  sent over by accident. Thanks

## 2024-07-10 NOTE — Telephone Encounter (Signed)
 Tried to contact pharmacy unable to get through. Message sent to PA TEAM to do PA for Zepbound  not Mounjaro .

## 2024-07-12 ENCOUNTER — Telehealth: Payer: Self-pay

## 2024-07-12 ENCOUNTER — Other Ambulatory Visit (HOSPITAL_COMMUNITY): Payer: Self-pay

## 2024-07-12 NOTE — Telephone Encounter (Signed)
 Pharmacy Patient Advocate Encounter   Received notification from Pt Calls Messages that prior authorization for Zepbound  Auto-injectos is required/requested.   Insurance verification completed.   The patient is insured through [facility] .   Per test claim:

## 2024-07-18 ENCOUNTER — Ambulatory Visit (INDEPENDENT_AMBULATORY_CARE_PROVIDER_SITE_OTHER): Admitting: Internal Medicine

## 2024-07-18 ENCOUNTER — Encounter: Payer: Self-pay | Admitting: Internal Medicine

## 2024-07-18 VITALS — BP 120/80 | HR 85 | Ht 63.5 in | Wt 177.8 lb

## 2024-07-18 DIAGNOSIS — D352 Benign neoplasm of pituitary gland: Secondary | ICD-10-CM

## 2024-07-18 DIAGNOSIS — E221 Hyperprolactinemia: Secondary | ICD-10-CM | POA: Diagnosis not present

## 2024-07-18 NOTE — Progress Notes (Unsigned)
 Name: Monica Jacobs  MRN/ DOB: [id number], [birth year]    Age/ Sex: 33 y.o., female     PCP: Job Lukes, PA   Reason for Endocrinology Evaluation: Pituitary microadenoma      Initial Endocrinology Clinic Visit: 02/12/2021    PATIENT IDENTIFIER: Monica Jacobs is a 33 y.o., female with a past medical history of .Pituitary microadenoma  She has followed with [facility] since 02/12/21 for consultative assistance with management of her Pituitary microadenoma .      HISTORICAL SUMMARY:   During evaluation for oligomenorrhea  In 10/2020 she was noted to have elevated prolactin 86.1 ng/mL which prompted a brain MRI revealing a pituitary microadenoma of 8 mm .     She is S/P C-section 07/27/2019 , she pumped for ~ 6 months post delivery , but continued to have nipple discharge until 11/2020  Her prolactin level was slightly elevated at our clinic at 33.3 NG/mL, she was started on cabergoline  in 01/2021   Prior to pregnancy she had regular periods but since delivery she noted irregular periods. Menarche at age 36   No FH of pituitary disease    On her 08/2021 visit the patient ran out of cabergoline , and we opted to remain off since her prolactin level was within normal range.  She became pregnant by 03/2022 and we stopped cabergoline    She is s/p C-section 11/26/2022- Boy   Restarted cabergoline  in May, 2025 with a prolactin level of 39.9 NG/mL.  The patient had just finished pumping in February, 2025  Due to inability to lose weight we proceeded with cortisol testing, patient had normal ACTH , serum cortisol and 24-hour urinary cortisol, she did have an nonsuppressed cortisol with dexamethasone  testing, but there is no concomitant dexamethasone  in May, 2025  SUBJECTIVE:   Today (07/18/2024):  Ms. 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   Depression    Environmental allergies    H/O: pituitary tumor    Past Surgical History:  Past Surgical History:  Procedure Laterality Date   CESAREAN SECTION N/A 07/27/2019   Procedure: CESAREAN SECTION;  Surgeon: Dannielle Bouchard, DO;  Location: [facility] LD [facility]S;  Service: Obstetrics;  Laterality: N/A;  Primary edc 08/03/19 NKDA Tracey RNFA   CESAREAN SECTION N/A 11/26/2022   Procedure: REPEAT CESAREAN SECTION EDC: 12-03-22  ALLERG: NKDA  PREVIOUS X 1;  Surgeon: Dannielle Bouchard, DO;  Location: [facility] LD ORS;  Service: Obstetrics;  Laterality: N/A;   EYE SURGERY     NO PAST SURGERIES     Social History:  reports that she has never smoked. She has never used smokeless tobacco. She reports current alcohol use. She reports that she does not use drugs. Family History:  Family History  Problem Relation Age of Onset   Anxiety disorder Mother    Hypercholesterolemia Mother    ADD / ADHD Mother    Cancer Father        skin   Mental  illness Sister    Heart attack Maternal Grandmother    Diabetes Maternal Grandfather    Heart disease Maternal Grandfather    Asthma Son    Colon cancer Neg Hx    Breast cancer Neg Hx    Prostate cancer Neg Hx      HOME MEDICATIONS: Allergies as of 07/18/2024   No Known Allergies      Medication List        Accurate as of July 18, 2024  8:44 AM. If you have any questions, ask your nurse or doctor.          atomoxetine  10 MG capsule Commonly known as: Strattera  Take 1 capsule (10 mg total) by mouth daily.   cabergoline  0.5 MG tablet Commonly known as: DOSTINEX  Take 0.5  tablets (0.25 mg total) by mouth 2 (two) times a week.   multivitamin tablet Take 1 tablet by mouth daily.   Zepbound  2.5 MG/0.5ML Pen Generic drug: tirzepatide  Inject 2.5 mg into the skin once a week.          OBJECTIVE:   PHYSICAL EXAM: VS: BP 120/80 (BP Location: Left Arm, Patient Position: Sitting, Cuff Size: Normal)   Pulse 85   Ht 5' 3.5 (1.613 m)   Wt 177 lb 12.8 oz (80.6 kg)   LMP 06/21/2024 (Exact Date)   SpO2 97%   BMI 31.00 kg/m    EXAM: General: Pt appears well and is in NAD  Neck: General: Supple without adenopathy. Thyroid : Thyroid  size normal.  No goiter or nodules appreciated.   Lungs: Clear with good BS   Heart: Auscultation: RRR.  Abdomen: Soft, nontender  Extremities:  BL LE: No pretibial edema   Mental Status: Judgment, insight: Intact Mood and affect: No depression, anxiety, or agitation     DATA REVIEWED:    Latest Reference Range & Units 03/14/24 08:51  Sodium 135 - 146 mmol/L 140  Potassium 3.5 - 5.3 mmol/L 4.5  Chloride 98 - 110 mmol/L 104  CO2 20 - 32 mmol/L 27  Glucose 65 - 99 mg/dL 76  BUN 7 - 25 mg/dL 11  Creatinine 9.49 - 9.02 mg/dL 9.17  Calcium 8.6 - 89.7 mg/dL 9.8  BUN/Creatinine Ratio 6 - 22 (calc) SEE NOTE:  eGFR > OR = 60 mL/min/1.32m2 96  LH mIU/mL 4.7  FSH mIU/mL 3.9  Prolactin ng/mL 39.1 (H)  Estradiol  pg/mL 29  TSH mIU/L 0.67  T4,Free(Direct) 0.8 - 1.8 ng/dL 0.9    Brain MRI 04/02/7976     There is a 7 x 8 x 6 mm T2 hyperintense (series 5, image 7) and hypoenhancing lesion in the posterior left pituitary gland. The lesion appears to have some enhancement along its periphery, which doesn't change substantially with dynamic contrast. The central portion lesion is largely hypoenhancing on dynamic contrast. Resulting slight upward convexity of the left pituitary. Infundibulum is within normal limits and essentially midline.    MRI 02/19/2022  Approximately 8 mm hypoenhancing lesion in the posterior  left pituitary. Differential considerations include a pituitary microadenoma (given provided clinical history and suggestion of some enhancement along the periphery of the lesion) versus a pituitary cyst (given T2 hyperintensity and little enhancement centrally with dynamic contrast).     MRI 04/30/2024  FINDINGS: Brain: Dedicated thin slice pre and postcontrast pituitary details are below.   Cerebral volume remains normal. No restricted diffusion to suggest acute infarction. No midline shift, mass effect, evidence of mass lesion, ventriculomegaly, extra-axial collection or acute intracranial hemorrhage. Cervicomedullary junction  is within normal limits.   Elnor and white matter signal is within normal limits throughout the brain. No encephalomalacia or chronic cerebral blood products. No abnormal gray or white matter enhancement. No dural thickening.   Vascular:  Major intracranial vascular flow voids are stable.   Skull and upper cervical spine: Visualized bone marrow signal is within normal limits. Negative visible cervical spine.   Sinuses/Orbits: Negative.  Paranasal sinuses and mastoids are clear.   Other: Dedicated thin slice pre and postcontrast pituitary imaging. Round, crescent-shaped 7-8 mm T2 hyperintense and T1 hypointense lesion occupying the posterior left sella turcica (series 16, image 5 and series 25, image 8) appears unchanged since 2022. Infundibulum is mildly deviated to the right as before but otherwise normal. No suprasellar mass or mass effect. Normal hypothalamus.   Cavernous sinuses appear stable and normal.   On dynamic and delayed post-contrast images the remainder of the pituitary gland enhances within normal limits. And there is no convincing enhancement of the chronic T2 hyperintense lesion.   IMPRESSION: 1. Stable since 2022 7-8 mm probable non-enhancing cyst of the posterior left sella turcica. No regional invasion, and no progressive  regional mass effect. In the absence of endocrinopathy additional imaging evaluation may not be valuable as per ACR consensus guidelines: Management of Incidental Pituitary Findings on CT, MRI and F18-FDG PET: A White Paper of the ACR Incidental Findings Committee. J Am Coll Radiol 2018; 15: 966-72.   2. Otherwise normal MRI appearance of the brain.       ASSESSMENT / PLAN / RECOMMENDATIONS:   Pituitary Microadenoma:   - No local symptoms - Pituitary hormones, have all been normal except prolactin in the past - Up-to-date on MRI, no further imaging was recommended  - ACTH , cortisol, TFTs were normal, patient did have abnormal dexamethasone  suppression test  (no concomitant dexamethasone  ), 24-hour urinary cortisol was normal at 44.5 MCGin May, 2025     2. Prolactinoma :     -She has been off cabergoline  since 2023 when she became pregnant - Cabergoline  was restarted in May, 2025 with elevated prolactin - Patient is asymptomatic  Medication  cabergoline  0.5 mg, half a tablet twice weekly   Follow-up in 6 months    Signed electronically by: Stefano Redgie Butts, MD  [facility]  [facility] [address] [place] [place], [place] [address] Phone: [phone] FAX: [phone]      CC: Job Lukes, [place] [address] [place] [address] Phone: [phone]  Fax: [phone]   Return to Endocrinology clinic as below: Future Appointments  Date Time Provider Department Center  07/05/2025  8:20 AM Job Lukes, PA [facility] [place]

## 2024-07-19 ENCOUNTER — Ambulatory Visit: Payer: Self-pay | Admitting: Internal Medicine

## 2024-07-19 LAB — PROLACTIN: Prolactin: 12.7 ng/mL

## 2024-07-19 MED ORDER — CABERGOLINE 0.5 MG PO TABS: 0.2500 mg | ORAL_TABLET | ORAL | 2 refills | Status: AC

## 2024-07-27 DIAGNOSIS — H9209 Otalgia, unspecified ear: Secondary | ICD-10-CM | POA: Diagnosis not present

## 2024-08-04 ENCOUNTER — Other Ambulatory Visit: Payer: Self-pay | Admitting: Physician Assistant

## 2024-08-15 DIAGNOSIS — Z01419 Encounter for gynecological examination (general) (routine) without abnormal findings: Secondary | ICD-10-CM | POA: Diagnosis not present

## 2024-08-15 DIAGNOSIS — Z6830 Body mass index (BMI) 30.0-30.9, adult: Secondary | ICD-10-CM | POA: Diagnosis not present

## 2025-01-16 ENCOUNTER — Ambulatory Visit: Admitting: Internal Medicine

## 2025-07-05 ENCOUNTER — Encounter: Admitting: Physician Assistant
